# Patient Record
Sex: Male | Born: 1955 | Race: Black or African American | Hispanic: No | Marital: Single | Smoking: Current every day smoker
Health system: Southern US, Community
[De-identification: ages and names within clinical notes are randomized; demographics above are authoritative.]

## PROBLEM LIST (undated history)

## (undated) ENCOUNTER — Emergency Department (HOSPITAL_COMMUNITY): Discharge status: Absent without leave | Payer: Self-pay

## (undated) DIAGNOSIS — M5126 Other intervertebral disc displacement, lumbar region: Secondary | ICD-10-CM

---

## 2004-08-11 ENCOUNTER — Emergency Department (HOSPITAL_COMMUNITY): Admission: EM | Admit: 2004-08-11 | Discharge: 2004-08-11 | Payer: Self-pay | Admitting: Emergency Medicine

## 2008-02-26 DIAGNOSIS — M5126 Other intervertebral disc displacement, lumbar region: Secondary | ICD-10-CM

## 2008-02-26 HISTORY — DX: Other intervertebral disc displacement, lumbar region: M51.26

## 2015-05-17 ENCOUNTER — Inpatient Hospital Stay (HOSPITAL_COMMUNITY)
Admission: EM | Admit: 2015-05-17 | Discharge: 2015-05-20 | DRG: 871 | Discharge status: Against Medical Advice | Payer: Medicaid Other | Attending: Internal Medicine | Admitting: Internal Medicine

## 2015-05-17 ENCOUNTER — Emergency Department (HOSPITAL_COMMUNITY): Payer: Medicaid Other

## 2015-05-17 ENCOUNTER — Encounter (HOSPITAL_COMMUNITY): Payer: Self-pay

## 2015-05-17 DIAGNOSIS — J9602 Acute respiratory failure with hypercapnia: Secondary | ICD-10-CM

## 2015-05-17 DIAGNOSIS — E876 Hypokalemia: Secondary | ICD-10-CM | POA: Diagnosis not present

## 2015-05-17 DIAGNOSIS — A411 Sepsis due to other specified staphylococcus: Principal | ICD-10-CM | POA: Diagnosis present

## 2015-05-17 DIAGNOSIS — F1110 Opioid abuse, uncomplicated: Secondary | ICD-10-CM | POA: Diagnosis present

## 2015-05-17 DIAGNOSIS — F1410 Cocaine abuse, uncomplicated: Secondary | ICD-10-CM | POA: Diagnosis present

## 2015-05-17 DIAGNOSIS — E872 Acidosis, unspecified: Secondary | ICD-10-CM | POA: Insufficient documentation

## 2015-05-17 DIAGNOSIS — R404 Transient alteration of awareness: Secondary | ICD-10-CM | POA: Diagnosis present

## 2015-05-17 DIAGNOSIS — J9690 Respiratory failure, unspecified, unspecified whether with hypoxia or hypercapnia: Secondary | ICD-10-CM | POA: Insufficient documentation

## 2015-05-17 DIAGNOSIS — R4189 Other symptoms and signs involving cognitive functions and awareness: Secondary | ICD-10-CM

## 2015-05-17 DIAGNOSIS — D72820 Lymphocytosis (symptomatic): Secondary | ICD-10-CM | POA: Diagnosis present

## 2015-05-17 DIAGNOSIS — N179 Acute kidney failure, unspecified: Secondary | ICD-10-CM | POA: Diagnosis present

## 2015-05-17 DIAGNOSIS — J9601 Acute respiratory failure with hypoxia: Secondary | ICD-10-CM | POA: Diagnosis present

## 2015-05-17 DIAGNOSIS — R739 Hyperglycemia, unspecified: Secondary | ICD-10-CM | POA: Diagnosis present

## 2015-05-17 DIAGNOSIS — R0600 Dyspnea, unspecified: Secondary | ICD-10-CM | POA: Diagnosis not present

## 2015-05-17 DIAGNOSIS — G9340 Encephalopathy, unspecified: Secondary | ICD-10-CM | POA: Diagnosis present

## 2015-05-17 DIAGNOSIS — J690 Pneumonitis due to inhalation of food and vomit: Secondary | ICD-10-CM | POA: Diagnosis present

## 2015-05-17 DIAGNOSIS — F1910 Other psychoactive substance abuse, uncomplicated: Secondary | ICD-10-CM | POA: Diagnosis not present

## 2015-05-17 LAB — CBC WITH DIFFERENTIAL/PLATELET
BASOS ABS: 0.2 10*3/uL — AB (ref 0.0–0.1)
BASOS ABS: 0 10*3/uL (ref 0.0–0.1)
BASOS PCT: 0 %
Basophils Relative: 1 %
EOS ABS: 0.1 10*3/uL (ref 0.0–0.7)
EOS PCT: 3 %
Eosinophils Absolute: 0.5 10*3/uL (ref 0.0–0.7)
Eosinophils Relative: 1 %
HEMATOCRIT: 46.2 % (ref 39.0–52.0)
HEMATOCRIT: 44.6 % (ref 39.0–52.0)
Hemoglobin: 14.4 g/dL (ref 13.0–17.0)
Hemoglobin: 14.1 g/dL (ref 13.0–17.0)
LYMPHS ABS: 9.7 10*3/uL — AB (ref 0.7–4.0)
Lymphocytes Relative: 62 %
Lymphocytes Relative: 18 %
Lymphs Abs: 1.6 10*3/uL (ref 0.7–4.0)
MCH: 30.2 pg (ref 26.0–34.0)
MCH: 29.9 pg (ref 26.0–34.0)
MCHC: 31.2 g/dL (ref 30.0–36.0)
MCHC: 31.6 g/dL (ref 30.0–36.0)
MCV: 96.9 fL (ref 78.0–100.0)
MCV: 94.7 fL (ref 78.0–100.0)
MONO ABS: 1.6 10*3/uL — AB (ref 0.1–1.0)
MONO ABS: 0.8 10*3/uL (ref 0.1–1.0)
MONOS PCT: 10 %
MONOS PCT: 9 %
NEUTROS ABS: 3.8 10*3/uL (ref 1.7–7.7)
NEUTROS ABS: 6.4 10*3/uL (ref 1.7–7.7)
Neutrophils Relative %: 24 %
Neutrophils Relative %: 72 %
PLATELETS: 226 10*3/uL (ref 150–400)
PLATELETS: 188 10*3/uL (ref 150–400)
RBC: 4.77 MIL/uL (ref 4.22–5.81)
RBC: 4.71 MIL/uL (ref 4.22–5.81)
RDW: 14 % (ref 11.5–15.5)
RDW: 14 % (ref 11.5–15.5)
WBC: 15.8 10*3/uL — ABNORMAL HIGH (ref 4.0–10.5)
WBC: 8.9 10*3/uL (ref 4.0–10.5)

## 2015-05-17 LAB — BASIC METABOLIC PANEL
ANION GAP: 10 (ref 5–15)
BUN: 8 mg/dL (ref 6–20)
CALCIUM: 8.3 mg/dL — AB (ref 8.9–10.3)
CO2: 20 mmol/L — AB (ref 22–32)
CREATININE: 0.92 mg/dL (ref 0.61–1.24)
Chloride: 111 mmol/L (ref 101–111)
Glucose, Bld: 86 mg/dL (ref 65–99)
Potassium: 4.2 mmol/L (ref 3.5–5.1)
SODIUM: 141 mmol/L (ref 135–145)

## 2015-05-17 LAB — BLOOD GAS, ARTERIAL
Acid-base deficit: 5.5 mmol/L — ABNORMAL HIGH (ref 0.0–2.0)
Bicarbonate: 19.2 mEq/L — ABNORMAL LOW (ref 20.0–24.0)
Drawn by: 257701
FIO2: 1
MECHVT: 620 mL
O2 Saturation: 99.4 %
PATIENT TEMPERATURE: 98.6
PEEP/CPAP: 5 cmH2O
PO2 ART: 346 mmHg — AB (ref 80.0–100.0)
RATE: 23 resp/min
TCO2: 16.9 mmol/L (ref 0–100)
pCO2 arterial: 36.5 mmHg (ref 35.0–45.0)
pH, Arterial: 7.34 — ABNORMAL LOW (ref 7.350–7.450)

## 2015-05-17 LAB — COMPREHENSIVE METABOLIC PANEL
ALT: 56 U/L (ref 17–63)
ANION GAP: 13 (ref 5–15)
AST: 78 U/L — AB (ref 15–41)
Albumin: 3.7 g/dL (ref 3.5–5.0)
Alkaline Phosphatase: 71 U/L (ref 38–126)
BILIRUBIN TOTAL: 0.5 mg/dL (ref 0.3–1.2)
BUN: 8 mg/dL (ref 6–20)
CHLORIDE: 105 mmol/L (ref 101–111)
CO2: 20 mmol/L — ABNORMAL LOW (ref 22–32)
Calcium: 8.4 mg/dL — ABNORMAL LOW (ref 8.9–10.3)
Creatinine, Ser: 1.34 mg/dL — ABNORMAL HIGH (ref 0.61–1.24)
GFR, EST NON AFRICAN AMERICAN: 56 mL/min — AB (ref >60)
Glucose, Bld: 283 mg/dL — ABNORMAL HIGH (ref 65–99)
POTASSIUM: 3.5 mmol/L (ref 3.5–5.1)
Sodium: 138 mmol/L (ref 135–145)
TOTAL PROTEIN: 7.1 g/dL (ref 6.5–8.1)

## 2015-05-17 LAB — PROTIME-INR
INR: 1.45 (ref 0.00–1.49)
Prothrombin Time: 17.2 seconds — ABNORMAL HIGH (ref 11.6–15.2)

## 2015-05-17 LAB — I-STAT CG4 LACTIC ACID, ED: Lactic Acid, Venous: 4.66 mmol/L (ref 0.5–2.0)

## 2015-05-17 LAB — AMMONIA: AMMONIA: 52 umol/L — AB (ref 9–35)

## 2015-05-17 LAB — GLUCOSE, CAPILLARY: GLUCOSE-CAPILLARY: 88 mg/dL (ref 65–99)

## 2015-05-17 LAB — MRSA PCR SCREENING: MRSA BY PCR: NEGATIVE

## 2015-05-17 LAB — ETHANOL: Alcohol, Ethyl (B): <5 mg/dL (ref <5)

## 2015-05-17 LAB — LACTIC ACID, PLASMA: Lactic Acid, Venous: 2.3 mmol/L (ref 0.5–2.0)

## 2015-05-17 MED ORDER — NALOXONE HCL 2 MG/2ML IJ SOSY
PREFILLED_SYRINGE | INTRAMUSCULAR | Status: AC
  Administered 2015-05-17: 1 mg
  Filled 2015-05-17: qty 2

## 2015-05-17 MED ORDER — ONDANSETRON HCL 4 MG/2ML IJ SOLN: 4.0000 mg | Freq: Four times a day (QID) | INTRAMUSCULAR | Status: DC | PRN

## 2015-05-17 MED ORDER — PROPOFOL 1000 MG/100ML IV EMUL
0.0000 ug/kg/min | INTRAVENOUS | Status: DC
  Administered 2015-05-17: 30 ug/kg/min via INTRAVENOUS
  Administered 2015-05-18: 50 ug/kg/min via INTRAVENOUS
  Administered 2015-05-18: 30 ug/kg/min via INTRAVENOUS
  Administered 2015-05-18: 50 ug/kg/min via INTRAVENOUS
  Filled 2015-05-17 (×4): qty 100

## 2015-05-17 MED ORDER — SODIUM CHLORIDE 0.9 % IV SOLN
3.0000 g | Freq: Three times a day (TID) | INTRAVENOUS | Status: DC
  Administered 2015-05-17 – 2015-05-18 (×2): 3 g via INTRAVENOUS
  Filled 2015-05-17 (×3): qty 3

## 2015-05-17 MED ORDER — ETOMIDATE 2 MG/ML IV SOLN
20.0000 mg | Freq: Once | INTRAVENOUS | Status: AC
  Administered 2015-05-17: 20 mg via INTRAVENOUS

## 2015-05-17 MED ORDER — MIDAZOLAM HCL 2 MG/2ML IJ SOLN
2.0000 mg | Freq: Once | INTRAMUSCULAR | Status: AC
Start: 2015-05-17 — End: 2015-05-17
  Administered 2015-05-17: 2 mg via INTRAVENOUS

## 2015-05-17 MED ORDER — PROPOFOL 10 MG/ML IV BOLUS
80.0000 mg | Freq: Once | INTRAVENOUS | Status: AC
  Administered 2015-05-17: 80 mg via INTRAVENOUS

## 2015-05-17 MED ORDER — ACETAMINOPHEN 325 MG PO TABS
650.0000 mg | ORAL_TABLET | ORAL | Status: DC | PRN
  Administered 2015-05-19: 650 mg via ORAL
  Filled 2015-05-17: qty 2

## 2015-05-17 MED ORDER — SODIUM CHLORIDE 0.9 % IV SOLN
INTRAVENOUS | Status: DC
  Administered 2015-05-17: 16:00:00 via INTRAVENOUS
  Administered 2015-05-18: 1000 mL via INTRAVENOUS

## 2015-05-17 MED ORDER — ENOXAPARIN SODIUM 40 MG/0.4ML ~~LOC~~ SOLN
40.0000 mg | SUBCUTANEOUS | Status: DC
  Administered 2015-05-17 – 2015-05-19 (×3): 40 mg via SUBCUTANEOUS
  Filled 2015-05-17 (×3): qty 0.4

## 2015-05-17 MED ORDER — PROPOFOL 1000 MG/100ML IV EMUL
5.0000 ug/kg/min | Freq: Once | INTRAVENOUS | Status: AC
  Administered 2015-05-17: 7 ug/kg/min via INTRAVENOUS

## 2015-05-17 MED ORDER — FENTANYL CITRATE (PF) 100 MCG/2ML IJ SOLN
100.0000 ug | INTRAMUSCULAR | Status: AC | PRN
  Administered 2015-05-18 (×3): 100 ug via INTRAVENOUS
  Filled 2015-05-17 (×4): qty 2

## 2015-05-17 MED ORDER — SODIUM CHLORIDE 0.9 % IV SOLN: 250.0000 mL | INTRAVENOUS | Status: DC | PRN

## 2015-05-17 MED ORDER — MIDAZOLAM HCL 2 MG/2ML IJ SOLN
2.0000 mg | Freq: Once | INTRAMUSCULAR | Status: AC
  Administered 2015-05-17: 2 mg via INTRAVENOUS

## 2015-05-17 MED ORDER — FAMOTIDINE IN NACL 20-0.9 MG/50ML-% IV SOLN
20.0000 mg | Freq: Two times a day (BID) | INTRAVENOUS | Status: DC
  Administered 2015-05-17 – 2015-05-18 (×3): 20 mg via INTRAVENOUS
  Filled 2015-05-17 (×3): qty 50

## 2015-05-17 MED ORDER — SODIUM CHLORIDE 0.9 % IV SOLN
INTRAVENOUS | Status: DC
  Administered 2015-05-17: 18:00:00 via INTRAVENOUS
  Administered 2015-05-18: 1000 mL via INTRAVENOUS

## 2015-05-17 MED ORDER — INSULIN ASPART 100 UNIT/ML ~~LOC~~ SOLN
0.0000 [IU] | SUBCUTANEOUS | Status: DC
  Administered 2015-05-18 – 2015-05-19 (×2): 2 [IU] via SUBCUTANEOUS

## 2015-05-17 MED ORDER — PROPOFOL 1000 MG/100ML IV EMUL
INTRAVENOUS | Status: AC
  Administered 2015-05-17: 7 ug/kg/min via INTRAVENOUS
  Filled 2015-05-17: qty 100

## 2015-05-17 MED ORDER — FENTANYL CITRATE (PF) 100 MCG/2ML IJ SOLN
100.0000 ug | INTRAMUSCULAR | Status: DC | PRN
  Administered 2015-05-18: 100 ug via INTRAVENOUS
  Filled 2015-05-17: qty 2

## 2015-05-17 MED ORDER — MIDAZOLAM HCL 2 MG/2ML IJ SOLN
INTRAMUSCULAR | Status: AC
  Filled 2015-05-17: qty 2

## 2015-05-17 MED ORDER — SODIUM CHLORIDE 0.9 % IV BOLUS (SEPSIS)
1000.0000 mL | Freq: Once | INTRAVENOUS | Status: AC
Start: 2015-05-17 — End: 2015-05-17
  Administered 2015-05-17: 1000 mL via INTRAVENOUS

## 2015-05-17 MED ORDER — SUCCINYLCHOLINE CHLORIDE 20 MG/ML IJ SOLN
100.0000 mg | Freq: Once | INTRAMUSCULAR | Status: AC
  Administered 2015-05-17: 100 mg via INTRAVENOUS

## 2015-05-17 NOTE — Progress Notes (Signed)
Pharmacy Antibiotic Note  Leonard Leonard Bray Leonard Bray is a 60 y.o. male admitted on 05/17/2015 found unresponsive and intubated for airway protection, suspected aspiration pneumonia. Pharmacy has been consulted for Unasyn dosing. Medical history and allergies are unknown. 86kg. SCr elevated, CrCl ~60N.  Plan: Start Unasyn 3g IV q8h. Follow up renal fxn, culture results, and clinical course.   Weight: 190 lb (86.183 kg)  Temp (24hrs), Avg:94.5 F (34.7 C), Min:93.6 F (34.2 C), Max:95 F (35 C)   Recent Labs Lab 05/17/15 1445 05/17/15 1457  WBC 15.8*  --   CREATININE 1.34*  --   LATICACIDVEN  --  4.66*    CrCl cannot be calculated (Unknown ideal weight.).    Allergies not on file  Antimicrobials this admission: Unasyn 3/22 >>   Dose adjustments this admission:  Microbiology results: BCx: UCx:  Sputum:  MRSA PCR:  Thank you for allowing pharmacy to be a part of this patient's care.  Leonard Leonard Bray, PharmD, pager [phone]. 05/17/2015,4:59 PM.

## 2015-05-17 NOTE — ED Notes (Signed)
Pt. Belongings were checked out by security and documented on the patient belonging sheet.  The key was left in pt. Belonging chart. Nurse aware.

## 2015-05-17 NOTE — ED Notes (Signed)
Bed: RESB Expected date:  Expected time:  Means of arrival:  Comments: 

## 2015-05-17 NOTE — Progress Notes (Signed)
Utilization Review completed.  Amadeus Oyama RN CM  

## 2015-05-17 NOTE — ED Notes (Signed)
Pt was pulled out of the car. Friend who was driving patient is not sure what happened to patient, reports that he went unresponsive in the car. Pt arrives with agonal breathing, diaphoretic, unresponsive. MD at bedside.

## 2015-05-17 NOTE — Progress Notes (Signed)
Patient listed as having Medicaid [place] access without a pcp.  Pcp listed on patient's Medicaid card [facility] [address], [place] [phone].  EDCM unable to speak to patient as he is intubated.

## 2015-05-17 NOTE — ED Provider Notes (Signed)
CSN: [id number]     Arrival date & time 05/17/15  1434 History   First MD Initiated Contact with Patient 05/17/15 1445     Chief Complaint  Patient presents with  . Unresponsive    HPI  Patient presents unresponsive, and extremities. Level V caveat secondary to medical condition. Per report the patient told a companion that he was feeling lightheaded. All been transported home, the patient became unresponsive. Patient himself cannot provide any details of this, as he is unresponsive. Patient was transported via private vehicle.  History reviewed. No pertinent past medical history. History reviewed. No pertinent past surgical history. No family history on file. Social History  Substance Use Topics  . Smoking status: None  . Smokeless tobacco: None  . Alcohol Use: None    Review of Systems  Unable to perform ROS: Patient unresponsive      Allergies  Review of patient's allergies indicates not on file.  Home Medications   Prior to Admission medications   Not on File   BP 84/57 mmHg  Pulse 74  Temp(Src) 94.6 F (34.8 C) (Core (Comment))  Resp 23  Wt 190 lb (86.183 kg)  SpO2 100% Physical Exam  Constitutional: He appears well-developed and well-nourished. He appears distressed.  HENT:  Head: Normocephalic and atraumatic.  Patient with gurgling respiration  Eyes: Conjunctivae are normal.  Disconjugate gaze, pinpoint pupils  Cardiovascular: Normal rate and regular rhythm.   Pulmonary/Chest: No stridor.  Decreased respiratory effort  Abdominal: He exhibits no distension.  Musculoskeletal: He exhibits no edema.  No gross deformities  Neurological: He is unresponsive.  Flaccid, does not follow commands, does not respond to painful sternal rub  Skin: Skin is warm and dry.  Psychiatric: Cognition and memory are impaired.  Nursing note and vitals reviewed.   ED Course  Procedures (including critical care time)  After the initial evaluation the patient received  intermuscular Narcan, with no change in his condition    After the initial evaluation, with concern for no airway protection given the patient's flaccid status, patient had intubation.  To facilitate IV access patient had IO line placed.  Angiocath insertion - Interosseous Performed by: Gerhard MunchLOCKWOOD, Cleta Heatley  Consent: Verbal consent obtained. Risks and benefits: risks, benefits and alternatives were considered, but with emergent need for access, consent was implied. Time out: Immediately prior to procedure a "time out" was called to verify the correct patient, procedure, equipment, support staff and site/side marked as required.  Preparation: Patient was prepped and draped in the usual sterile fashion.  L tibial anterior proximal    Normal blood return and flush without difficulty and bolus of NS  Patient tolerance: Patient tolerated the procedure well with no immediate complications.   INTUBATION Performed by: Gerhard MunchLOCKWOOD, Myria Steenbergen  Required items: required blood products, implants, devices, and special equipment available Patient identity confirmed: provided demographic data and hospital-assigned identification number Time out: Immediately prior to procedure a "time out" was called to verify the correct patient, procedure, equipment, support staff and site/side marked as required.  Indications: airway protection   Intubation method: Glidescope Laryngoscopy   Preoxygenation: BVM and [place] high flow  Sedatives: 20Etomidate Paralytic: 100Succinylcholine  Tube Size: 8 cuffed  Post-procedure assessment: chest rise and ETCO2 monitor Breath sounds: equal and absent over the epigastrium Tube secured with: ETT holder Chest x-ray interpreted by radiologist and me.  Chest x-ray findings: endotracheal tube in appropriate position  Patient tolerated the procedure well with no immediate complications.  Labs Review Labs Reviewed  COMPREHENSIVE METABOLIC PANEL - Abnormal;  Notable for the following:    CO2 20 (*)    Glucose, Bld 283 (*)    Creatinine, Ser 1.34 (*)    Calcium 8.4 (*)    AST 78 (*)    GFR calc non Af Amer 56 (*)    All other components within normal limits  CBC WITH DIFFERENTIAL/PLATELET - Abnormal; Notable for the following:    WBC 15.8 (*)    Lymphs Abs 9.7 (*)    Monocytes Absolute 1.6 (*)    Basophils Absolute 0.2 (*)    All other components within normal limits  PROTIME-INR - Abnormal; Notable for the following:    Prothrombin Time 17.2 (*)    All other components within normal limits  I-STAT CG4 LACTIC ACID, ED - Abnormal; Notable for the following:    Lactic Acid, Venous 4.66 (*)    All other components within normal limits  ETHANOL  PATHOLOGIST SMEAR REVIEW    Initial labs notable for lactic acidosis.     Imaging Review Ct Head Wo Contrast  05/17/2015  CLINICAL DATA:  Unresponsive/altered mental status EXAM: CT HEAD WITHOUT CONTRAST TECHNIQUE: Contiguous axial images were obtained from the base of the skull through the vertex without intravenous contrast. COMPARISON:  None. FINDINGS: The ventricles are normal in size and configuration. There is no intracranial mass, hemorrhage, extra-axial fluid collection, or midline shift. There is mild small vessel disease in the centra semiovale bilaterally. Elsewhere, gray-white compartments appear normal. No acute infarct evident. The bony calvarium appears intact. The mastoid air cells are clear. There is mucosal thickening in the right maxillary antrum. There is atrophy of the left globe with extensive calcification within the left globe. Right orbit appears unremarkable. IMPRESSION: Mild periventricular small vessel disease. No intracranial mass, hemorrhage, or extra-axial fluid collection. No acute infarct evident. Left phthisis bulbi. Right maxillary sinus disease. Electronically Signed   By: Bretta Bang III M.D.   On: 05/17/2015 14:59   Dg Chest Port 1 View  05/17/2015   CLINICAL DATA:  60 year old male with a history of endotracheal tube placement EXAM: PORTABLE CHEST 1 VIEW COMPARISON:  None. FINDINGS: Cardiomediastinal silhouette within normal limits. Fullness in the central vasculature with interstitial prominence, likely related to low lung volumes. Trace fluid or thickening of the minor fissure. No confluent airspace disease pneumothorax or pleural effusion. Endotracheal tube terminates suitably above the carina, measuring 5.7 cm. Overlying EKG leads. IMPRESSION: Endotracheal tube terminates suitably above the carina, 5.7 cm. Low lung volumes with likely atelectasis. No evidence of lobar pneumonia. Signed, Yvone Neu. Loreta Ave, DO [facility] Electronically Signed   By: Gilmer Mor D.O.   On: 05/17/2015 15:06   I have personally reviewed and evaluated these images and lab results as part of my medical decision-making.   EKG Interpretation   Date/Time:  Wednesday May 17 2015 14:37:06 EDT Ventricular Rate:  82 PR Interval:  163 QRS Duration: 106 QT Interval:  406 QTC Calculation: 474 R Axis:   31 Text Interpretation:  Sinus rhythm Probable anteroseptal infarct, old  Sinus rhythm Artifact T wave abnormality Abnormal ekg Confirmed by  Gerhard Munch  MD [phone]) on 05/17/2015 2:54:32 PM     4:02 PM After initial sedation the patient had a spell of agitation, during which the patient had minimal propofol drip infusing, the patient was substantially more sedated after bolus, 80 mg, and increase in the titration.Marland Kitchen   I  have discussed the patient's case with our critical care colleagues.  MDM   This patient presents in extremis, unresponsive, flaccid, with no response to painful stimuli.   Patient's history is unknown, and with the concern for infection he had intubation. Patient received fluids, Narcan, oxygen, with no change in his mental status. Patient's initial CT scan did not show acute  bleeding, but initial labs were notable for lactic acidosis, leukocytosis. Presentation may be secondary to occult stroke versus ingestion versus seizure versus occult infection. Patient required continuous propofol sedation for facilitation of care, and on sign and is awaiting critical care evaluation for mission to the ICU.   CRITICAL CARE Performed by: Gerhard Munch Total critical care time: 35 minutes Critical care time was exclusive of separately billable procedures and treating other patients. Critical care was necessary to treat or prevent imminent or life-threatening deterioration. Critical care was time spent personally by me on the following activities: development of treatment plan with patient and/or surrogate as well as nursing, discussions with consultants, evaluation of patient's response to treatment, examination of patient, obtaining history from patient or surrogate, ordering and performing treatments and interventions, ordering and review of laboratory studies, ordering and review of radiographic studies, pulse oximetry and re-evaluation of patient's condition.    Gerhard Munch, MD 05/17/15 [phone]

## 2015-05-17 NOTE — H&P (Signed)
PULMONARY / CRITICAL CARE MEDICINE   Name: Leonard Bray MRN: [id number] DOB: [birth year]    ADMISSION DATE:  05/17/2015 CONSULTATION DATE:  05/17/2015  REFERRING MD:  Dr. Jeraldine Loots, ER physician  CHIEF COMPLAINT:  Acute encephalopathy  HISTORY OF PRESENT ILLNESS:   This is a 60 year old male who was found unresponsive today and brought to the emergency department. He was intubated for airway protection. Apparently he asked a friend to bring him because he felt lightheaded. The friend did not stay nor did she provide more history. [facility] police stated that he had been found at a gas station. They note a significant criminal history but they have no information about his medical history. History could not be obtained from the patient because he was encephalopathic in the emergency department. After intubation he did become combative and required propofol infusion.  We could not obtain past medical history family history, surgical history, allergies, or review of systems because he was encephalopathic.  SUBJECTIVE:  As above  VITAL SIGNS: BP 113/81 mmHg  Pulse 78  Temp(Src) 95 F (35 C) (Core (Comment))  Resp 23  Wt 86.183 kg (190 lb)  SpO2 100%  HEMODYNAMICS:    VENTILATOR SETTINGS: Vent Mode:  [-] PRVC FiO2 (%):  [100 %] 100 % Set Rate:  [23 bmp] 23 bmp Vt Set:  [478 mL] 620 mL PEEP:  [5 cmH20] 5 cmH20 Plateau Pressure:  [10 cmH20] 10 cmH20  INTAKE / OUTPUT:    PHYSICAL EXAMINATION: General:  Sedated on vent Neuro:  Heavily sedated, propofol infusion HEENT:  Normocephalic atraumatic, chronic appearing scarring and atrophy of the left eye, right pupil round and reactive to light, sclerae anicteric Cardiovascular:  Distant heart sounds, no clear murmurs on my exam, no JVD Lungs:  Few crackles bases, otherwise clear with ventilator supported breaths, there are what appear to be multiple surgical scars from thoracic trochars on his left chest Abdomen:  Bowel sounds  positive, nontender nondistended, there are surgical scars from what appears to be an exploratory laparotomy Musculoskeletal:  Normal bulk and tone Skin:  There are old, well-healed jagged scars on his left arm  LABS:  BMET  Recent Labs Lab 05/17/15 1445  NA 138  K 3.5  CL 105  CO2 20*  BUN 8  CREATININE 1.34*  GLUCOSE 283*    Electrolytes  Recent Labs Lab 05/17/15 1445  CALCIUM 8.4*    CBC  Recent Labs Lab 05/17/15 1445  WBC 15.8*  HGB 14.4  HCT 46.2  PLT 226    Coag's  Recent Labs Lab 05/17/15 1445  INR 1.45    Sepsis Markers  Recent Labs Lab 05/17/15 1457  LATICACIDVEN 4.66*    ABG No results for input(s): PHART, PCO2ART, PO2ART in the last 168 hours.  Liver Enzymes  Recent Labs Lab 05/17/15 1445  AST 78*  ALT 56  ALKPHOS 71  BILITOT 0.5  ALBUMIN 3.7    Cardiac Enzymes No results for input(s): TROPONINI, PROBNP in the last 168 hours.  Glucose No results for input(s): GLUCAP in the last 168 hours.  Imaging Ct Head Wo Contrast  05/17/2015  CLINICAL DATA:  Unresponsive/altered mental status EXAM: CT HEAD WITHOUT CONTRAST TECHNIQUE: Contiguous axial images were obtained from the base of the skull through the vertex without intravenous contrast. COMPARISON:  None. FINDINGS: The ventricles are normal in size and configuration. There is no intracranial mass, hemorrhage, extra-axial fluid collection, or midline shift. There is mild small vessel disease in the centra semiovale bilaterally. Elsewhere,  gray-white compartments appear normal. No acute infarct evident. The bony calvarium appears intact. The mastoid air cells are clear. There is mucosal thickening in the right maxillary antrum. There is atrophy of the left globe with extensive calcification within the left globe. Right orbit appears unremarkable. IMPRESSION: Mild periventricular small vessel disease. No intracranial mass, hemorrhage, or extra-axial fluid collection. No acute infarct  evident. Left phthisis bulbi. Right maxillary sinus disease. Electronically Signed   By: Bretta BangWilliam  Woodruff III M.D.   On: 05/17/2015 14:59   Dg Chest Port 1 View  05/17/2015  CLINICAL DATA:  60 year old male with a history of endotracheal tube placement EXAM: PORTABLE CHEST 1 VIEW COMPARISON:  None. FINDINGS: Cardiomediastinal silhouette within normal limits. Fullness in the central vasculature with interstitial prominence, likely related to low lung volumes. Trace fluid or thickening of the minor fissure. No confluent airspace disease pneumothorax or pleural effusion. Endotracheal tube terminates suitably above the carina, measuring 5.7 cm. Overlying EKG leads. IMPRESSION: Endotracheal tube terminates suitably above the carina, 5.7 cm. Low lung volumes with likely atelectasis. No evidence of lobar pneumonia. Signed, Yvone NeuJaime S. Loreta AveWagner, DO [facility] Electronically Signed   By: Gilmer MorJaime  Wagner D.O.   On: 05/17/2015 15:06     STUDIES:  05/17/2015 CT head: No acute intracranial process  CULTURES: 05/17/2015 urine culture 05/12/2015 respiratory culture 05/17/2015 blood culture 05/17/2015 respiratory viral panel  ANTIBIOTICS: 05/17/2015 Unasyn  SIGNIFICANT EVENTS: 05/17/2015 admission  LINES/TUBES: 05/17/2015 endotracheal tube  DISCUSSION: This is a 60 year old male who is brought to the emergency department today after feeling lightheaded and then was acutely encephalopathic in the emergency department. The underlying cause is uncertain at this time. There is no evidence of acute traumatic or hemorrhagic process in the brain. He has a mildly elevated lactic acid which is uncertain etiology and could be due to poor perfusion that he has no evidence of this based on his physical exam or his vital signs. Differential diagnosis includes ischemic bowel or less likely seizure disorder as a cause of the elevated lactic acid. Thinking broadly, its  also possible that there is a metabolic derangement or he recently had an ingestion of some sort which led to his altered mental status. Regardless, the underlying cause at this time is unclear and we need a broad workup.  ASSESSMENT / PLAN:  NEUROLOGIC A:   Acute encephalopathy, differential diagnosis broad as stated above P:   RASS goal: -1 Propofol infusion for vent synchrony EEG TSH Pneumonia B12 Folate MRI Brain tomorrow if no improvement Urine drug screen  PULMONARY A: Acute respiratory failure with hypoxemia secondary to inability to control airway P:   Continue full ventilator support Ventilator associated pneumonia prevention Daily chest x-ray Daily wakeup assessment and spontaneous breathing trial ABG  CARDIOVASCULAR A:  No acute issues P:  Telemetry monitoring  RENAL A:   Mildly elevated creatinine, uncertain baseline P:   Repeat basic metabolic panel later today Monitor BMET and UOP Replace electrolytes as needed   GASTROINTESTINAL A:   History of exploratory laparotomy Elevated lactic acid, uncertain etiology P:   Repeat lactic acid, if elevated perform CT abdomen pelvis looking for ischemic bowel Pepcid for stress ulcer prophylaxis  HEMATOLOGIC A:   Leukocytosis with absolute lymphocytosis, uncertain etiology question viral illness P:  Repeat CBC with differential later today Monitor for bleeding Lovenox as needed for DVT prevention  INFECTIOUS A:   Question sepsis, but there is no clear source on his current physical exam, mild  interstitial changes on chest x-ray, question aspiration given altered mental status P:   Pan culture Start Unasyn for questionable aspiration pneumonia but would have low threshold to stop this tomorrow Rule out influenza given high flow activity in our area  ENDOCRINE A:   Hyperglycemia, uncertain baseline   P:   Check hemoglobin A1c Point care glucose monitoring    FAMILY  - Updates: None  bedside  - Inter-disciplinary family meet or Palliative Care meeting due by: day 7   My cc time 45 minutes  Heber [place], MD [facility] Pager: [phone] Cell: [phone] After 3pm or if no response, call [phone]   05/17/2015, 4:38 PM

## 2015-05-18 ENCOUNTER — Inpatient Hospital Stay (HOSPITAL_COMMUNITY)
Admit: 2015-05-18 | Discharge: 2015-05-18 | Disposition: A | Discharge status: Home / Self Care | Payer: Medicaid Other | Attending: Pulmonary Disease | Admitting: Pulmonary Disease

## 2015-05-18 ENCOUNTER — Inpatient Hospital Stay (HOSPITAL_COMMUNITY): Payer: Medicaid Other

## 2015-05-18 DIAGNOSIS — J9601 Acute respiratory failure with hypoxia: Secondary | ICD-10-CM

## 2015-05-18 DIAGNOSIS — R0600 Dyspnea, unspecified: Secondary | ICD-10-CM

## 2015-05-18 DIAGNOSIS — E872 Acidosis, unspecified: Secondary | ICD-10-CM | POA: Insufficient documentation

## 2015-05-18 DIAGNOSIS — G9340 Encephalopathy, unspecified: Secondary | ICD-10-CM

## 2015-05-18 DIAGNOSIS — J9690 Respiratory failure, unspecified, unspecified whether with hypoxia or hypercapnia: Secondary | ICD-10-CM | POA: Insufficient documentation

## 2015-05-18 LAB — BASIC METABOLIC PANEL
ANION GAP: 8 (ref 5–15)
BUN: 6 mg/dL (ref 6–20)
CALCIUM: 8.4 mg/dL — AB (ref 8.9–10.3)
CO2: 20 mmol/L — ABNORMAL LOW (ref 22–32)
Chloride: 113 mmol/L — ABNORMAL HIGH (ref 101–111)
Creatinine, Ser: 0.97 mg/dL (ref 0.61–1.24)
GFR calc Af Amer: >60 mL/min (ref >60)
GLUCOSE: 73 mg/dL (ref 65–99)
Potassium: 4 mmol/L (ref 3.5–5.1)
SODIUM: 141 mmol/L (ref 135–145)

## 2015-05-18 LAB — TRIGLYCERIDES: Triglycerides: 156 mg/dL — ABNORMAL HIGH (ref <150)

## 2015-05-18 LAB — CBC
HCT: 43.4 % (ref 39.0–52.0)
Hemoglobin: 14.4 g/dL (ref 13.0–17.0)
MCH: 30 pg (ref 26.0–34.0)
MCHC: 33.2 g/dL (ref 30.0–36.0)
MCV: 90.4 fL (ref 78.0–100.0)
PLATELETS: 183 10*3/uL (ref 150–400)
RBC: 4.8 MIL/uL (ref 4.22–5.81)
RDW: 14 % (ref 11.5–15.5)
WBC: 8.2 10*3/uL (ref 4.0–10.5)

## 2015-05-18 LAB — GLUCOSE, CAPILLARY
GLUCOSE-CAPILLARY: 79 mg/dL (ref 65–99)
GLUCOSE-CAPILLARY: 78 mg/dL (ref 65–99)
GLUCOSE-CAPILLARY: 106 mg/dL — AB (ref 65–99)
Glucose-Capillary: 140 mg/dL — ABNORMAL HIGH (ref 65–99)
Glucose-Capillary: 62 mg/dL — ABNORMAL LOW (ref 65–99)
Glucose-Capillary: 72 mg/dL (ref 65–99)
Glucose-Capillary: 85 mg/dL (ref 65–99)

## 2015-05-18 LAB — FOLATE RBC
FOLATE, HEMOLYSATE: 458.9 ng/mL
FOLATE, RBC: 1087 ng/mL (ref >498)
HEMATOCRIT: 42.2 % (ref 37.5–51.0)

## 2015-05-18 LAB — RPR: RPR: NONREACTIVE

## 2015-05-18 LAB — ECHOCARDIOGRAM COMPLETE
HEIGHTINCHES: 72 in
WEIGHTICAEL: 3075.86 [oz_av]

## 2015-05-18 LAB — HIV ANTIBODY (ROUTINE TESTING W REFLEX): HIV SCREEN 4TH GENERATION: NONREACTIVE

## 2015-05-18 LAB — PATHOLOGIST SMEAR REVIEW

## 2015-05-18 LAB — RAPID URINE DRUG SCREEN, HOSP PERFORMED
AMPHETAMINES: NOT DETECTED
BENZODIAZEPINES: POSITIVE — AB
Barbiturates: NOT DETECTED
Cocaine: POSITIVE — AB
OPIATES: POSITIVE — AB
Tetrahydrocannabinol: POSITIVE — AB

## 2015-05-18 LAB — VITAMIN B12: Vitamin B-12: 438 pg/mL (ref 180–914)

## 2015-05-18 LAB — CORTISOL: CORTISOL PLASMA: 2.1 ug/dL

## 2015-05-18 LAB — TSH: TSH: 1.678 u[IU]/mL (ref 0.350–4.500)

## 2015-05-18 LAB — LACTIC ACID, PLASMA: LACTIC ACID, VENOUS: 1.1 mmol/L (ref 0.5–2.0)

## 2015-05-18 MED ORDER — ANTISEPTIC ORAL RINSE SOLUTION (CORINZ)
7.0000 mL | Freq: Four times a day (QID) | OROMUCOSAL | Status: DC
  Administered 2015-05-18: 7 mL via OROMUCOSAL

## 2015-05-18 MED ORDER — DEXTROSE-NACL 5-0.45 % IV SOLN
INTRAVENOUS | Status: DC
  Administered 2015-05-18: 17:00:00 via INTRAVENOUS
  Administered 2015-05-18: 1000 mL via INTRAVENOUS

## 2015-05-18 MED ORDER — CHLORHEXIDINE GLUCONATE 0.12% ORAL RINSE (MEDLINE KIT)
15.0000 mL | Freq: Two times a day (BID) | OROMUCOSAL | Status: DC
  Administered 2015-05-18 (×2): 15 mL via OROMUCOSAL

## 2015-05-18 MED ORDER — SODIUM CHLORIDE 0.9 % IV SOLN
25.0000 ug/h | INTRAVENOUS | Status: DC
  Filled 2015-05-18: qty 50

## 2015-05-18 MED ORDER — VITAL AF 1.2 CAL PO LIQD
1000.0000 mL | ORAL | Status: DC
  Filled 2015-05-18: qty 1000

## 2015-05-18 MED ORDER — CLONIDINE HCL 0.2 MG/24HR TD PTWK
0.2000 mg | MEDICATED_PATCH | TRANSDERMAL | Status: DC
  Administered 2015-05-18: 0.2 mg via TRANSDERMAL
  Filled 2015-05-18: qty 1

## 2015-05-18 MED ORDER — VITAL HIGH PROTEIN PO LIQD
1000.0000 mL | ORAL | Status: DC
  Filled 2015-05-18: qty 1000

## 2015-05-18 MED ORDER — DEXMEDETOMIDINE HCL IN NACL 200 MCG/50ML IV SOLN
0.4000 ug/kg/h | INTRAVENOUS | Status: DC
  Administered 2015-05-18: 1.2 ug/kg/h via INTRAVENOUS
  Administered 2015-05-18: 1.8 ug/kg/h via INTRAVENOUS
  Administered 2015-05-18: 1.2 ug/kg/h via INTRAVENOUS
  Administered 2015-05-18 (×2): 0.4 ug/kg/h via INTRAVENOUS
  Administered 2015-05-19: 0.5 ug/kg/h via INTRAVENOUS
  Filled 2015-05-18 (×8): qty 50

## 2015-05-18 MED ORDER — PRO-STAT SUGAR FREE PO LIQD: 60.0000 mL | Freq: Two times a day (BID) | ORAL | Status: DC

## 2015-05-18 MED ORDER — VANCOMYCIN HCL IN DEXTROSE 1-5 GM/200ML-% IV SOLN
1000.0000 mg | Freq: Two times a day (BID) | INTRAVENOUS | Status: DC
  Administered 2015-05-18 – 2015-05-19 (×3): 1000 mg via INTRAVENOUS
  Filled 2015-05-18 (×3): qty 200

## 2015-05-18 MED ORDER — BUDESONIDE 0.5 MG/2ML IN SUSP: 0.5000 mg | Freq: Two times a day (BID) | RESPIRATORY_TRACT | Status: DC

## 2015-05-18 MED ORDER — IPRATROPIUM-ALBUTEROL 0.5-2.5 (3) MG/3ML IN SOLN
3.0000 mL | Freq: Four times a day (QID) | RESPIRATORY_TRACT | Status: DC
  Administered 2015-05-18 – 2015-05-19 (×3): 3 mL via RESPIRATORY_TRACT
  Filled 2015-05-18 (×4): qty 3

## 2015-05-18 MED ORDER — HYDROCORTISONE NA SUCCINATE PF 100 MG IJ SOLR
100.0000 mg | Freq: Three times a day (TID) | INTRAMUSCULAR | Status: DC
  Filled 2015-05-18: qty 2

## 2015-05-18 MED ORDER — SODIUM CHLORIDE 0.9 % IV SOLN
3.0000 g | Freq: Four times a day (QID) | INTRAVENOUS | Status: DC
  Filled 2015-05-18: qty 3

## 2015-05-18 MED ORDER — DEXTROSE 50 % IV SOLN
25.0000 mL | Freq: Once | INTRAVENOUS | Status: AC
  Administered 2015-05-18: 25 mL via INTRAVENOUS
  Filled 2015-05-18: qty 50

## 2015-05-18 NOTE — Progress Notes (Addendum)
Initial Nutrition Assessment  INTERVENTION:   If patient expected to remain intubated >24 hours, recommend nutrition support.  TF recommendations: Initiate Vital AF 1.2  @ 20 ml/hr via OGT and increase by 10 ml every 4 hours to goal rate of 40 ml/hr.   60 ml Prostat BID Tube feeding regimen +Propofol infusion provides 2096 kcal (98% of needs), 132 grams of protein, and 779 ml of H2O.   RD to continue to monitor for plan  **Consult placed by MD to initiate TF. Adjusted order to reflect recommendations provided above.  NUTRITION DIAGNOSIS:   Inadequate oral intake related to inability to eat as evidenced by NPO status.  GOAL:   Patient will meet greater than or equal to 90% of their needs  MONITOR:   Vent status, Labs, Weight trends, I & O's  REASON FOR ASSESSMENT:   Ventilator    ASSESSMENT:   60 year old male who was found unresponsive today and brought to the emergency department. He was intubated for airway protection. Apparently he asked a friend to bring him because he felt lightheaded. The friend did not stay nor did she provide more history. [place] that he had been found at a gas station. They note a significant criminal history but they have no information about his medical history. History could not be obtained from the patient because he was encephalopathic in the emergency department. After intubation he did become combative and required propofol infusion.  Patient in room with no family at bedside. No medical history available. Nutrition focused physical exam shows no sign of depletion of muscle mass or body fat.  Patient is currently intubated on ventilator support MV: 13.9 L/min Temp (24hrs), Avg:97.5 F (36.4 C), Min:93.6 F (34.2 C), Max:99.5 F (37.5 C)  Propofol: 20.6 ml/hr -> provides 544 fat kcal  Labs reviewed. Medications reviewed.   Diet Order:     Skin:  Reviewed, no issues  Last BM:  PTA  Height:   Ht Readings from  Last 1 Encounters:  05/17/15 6' (1.829 m)    Weight:   Wt Readings from Last 1 Encounters:  05/18/15 192 lb 3.9 oz (87.2 kg)    Ideal Body Weight:  80.9 kg  BMI:  Body mass index is 26.07 kg/(m^2).  Estimated Nutritional Needs:   Kcal:  2137  Protein:  125-135g  Fluid:  2L/day  EDUCATION NEEDS:   No education needs identified at this time  Tilda FrancoLindsey Kallen Mccrystal, MS, RD, LDN Pager: [phone] After Hours Pager: [phone]

## 2015-05-18 NOTE — Procedures (Signed)
ELECTROENCEPHALOGRAM REPORT  Date of Study: 05/18/2015  Patient's Name: Leonard Bray Due MRN: [id number] Date of Birth: 1955-09-12  Referring Provider: Max Fickleouglas McQuaid, MD  Indication: 60 year old male encephalopathic following possible seizure.  He is intubated and sedated on propofol and fentanyl.  Medications:    0.9 % sodium chloride infusion  acetaminophen (TYLENOL) tablet 650 mg  antiseptic oral rinse solution (CORINZ)  budesonide (PULMICORT) nebulizer solution 0.5 mg  chlorhexidine gluconate (PERIDEX) 0.12 % solution 15 mL  dexmedetomidine (PRECEDEX) 200 MCG/50ML (4 mcg/mL) infusion  enoxaparin (LOVENOX) injection 40 mg  famotidine (PEPCID) IVPB 20 mg premix  fentaNYL (SUBLIMAZE) 2,500 mcg in sodium chloride 0.9 % 250 mL (10 mcg/mL) infusion  hydrocortisone sodium succinate (SOLU-CORTEF) 100 MG injection 100 mg  insulin aspart (novoLOG) injection 0-15 Units  ipratropium-albuterol (DUONEB) 0.5-2.5 (3) MG/3ML nebulizer solution 3 mL  ondansetron (ZOFRAN) injection 4 mg  propofol (DIPRIVAN) 1000 MG/100ML infusion  vancomycin (VANCOCIN) IVPB 1000 mg/200 mL premix    Technical Summary: This is a multichannel digital EEG recording, using the international 10-20 placement system with electrodes applied with paste and impedances below 5000 ohms.    Description: The EEG is symmetric with suppression of the background, consisting of 6Hz  theta and 2-3 Hz delta activity and no discernible posterior dominant rhythm.  Diffuse beta activity is seen.   There is muscle artifact.  No focal or generalized epileptiform discharges are seen.  Stage II sleep is not seen.  Hyperventilation was not performed.  Photic stimulation was performed, and produced no abnormalities.  ECG poor and difficult to read due to artifact.  Impression: This is an abnormal EEG due to generalized slowing.  This is indicative of diffuse cerebral dysfunction which is a nonspecific finding that may  be seen in toxic-metabolic, hypoxic, infectious, pharmacologic or other diffuse physiologic etiology.  [name] [name], DO

## 2015-05-18 NOTE — Progress Notes (Signed)
Pt self-extubated. Rt placed on 2 LPM [place]. PT stable at this time. Pete babcock NP at bedside.

## 2015-05-18 NOTE — Progress Notes (Signed)
Hypoglycemic Event  CBG: 62  Treatment: D50 IV 25 mL  Symptoms: None  Follow-up CBG: Time:00:50 CBG Result:85  Possible Reasons for Event: Unknown  Comments/MD notified:N/A    Leonard Bray D

## 2015-05-18 NOTE — Progress Notes (Signed)
*  PRELIMINARY RESULTS* Echocardiogram 2D Echocardiogram has been performed.  Leonard Bray, Leonard Bray 05/18/2015, 2:19 PM

## 2015-05-18 NOTE — Progress Notes (Signed)
eLink Physician-Brief Progress Note Patient Name: Leonard Bray DOB: 1956/01/04 MRN: [id number]   Date of Service  05/18/2015  HPI/Events of Note  Contacted by bedside nurse questioning maintenance IV fluids, diet, and blood glucose checks.  Patient self extubated today. Reportedly has past nursing bedside swallow evaluation.. Patient previously had borderline hypoglycemia on Solu-Cortef and IV fluid was switched to dextrose containing IV fluid today.  eICU Interventions  1. DC normal saline maintenance IV fluid & continue D5 half normal saline. 2. Clear liquid diet as tolerated 3. Continue Accu-Cheks every 4 hours until patient taking substantial oral intake 4. Continue low-dose sliding scale insulin per algorithm     Intervention Category Intermediate Interventions: Other:  Lawanda CousinsJennings Derrill Bagnell 05/18/2015, 4:30 PM

## 2015-05-18 NOTE — Progress Notes (Addendum)
Pharmacy Antibiotic Note  Leonard Bray is a 60 y.o. male admitted on 05/17/2015 found unresponsive and intubated for airway protection, suspected aspiration pneumonia. Pharmacy has been consulted for Unasyn dosing. Medical history and allergies are unknown. Vancomycin added 3/23 for GPC clusters (so far in 1/2 blood cultures). Repeat blood cultures ordered   Plan:  Adjust amp/sulbactam to 3gm IV q6h - now discontinued  Vancomycin 1gm IV q12h  Check trough at steady state  Follow up blood culture and rule out possible contaminant   Height: 6' (182.9 cm) Weight: 192 lb 3.9 oz (87.2 kg) IBW/kg (Calculated) : 77.6  Temp (24hrs), Avg:97.5 F (36.4 C), Min:93.6 F (34.2 C), Max:99.5 F (37.5 C)   Recent Labs Lab 05/17/15 1445 05/17/15 1457 05/17/15 1724 05/17/15 2116 05/18/15 0318  WBC 15.8*  --   --  8.9 8.2  CREATININE 1.34*  --   --  0.92 0.97  LATICACIDVEN  --  4.66* 2.3*  --   --     Estimated Creatinine Clearance: 88.9 mL/min (by C-G formula based on Cr of 0.97).    Allergies not on file  Antimicrobials this admission: Unasyn 3/22 >>  vanco 3/23 >>  Dose adjustments this admission:  Microbiology results: 3/22 BCx: GPC clusters 1/2 3/22 MRSA PCR: neg 3/23 Bcx  Thank you for allowing pharmacy to be a part of this patient's care.  Juliette Alcideustin Briante Loveall, PharmD, BCPS.   Pager: [phone] 05/18/2015 8:59 AM

## 2015-05-18 NOTE — Progress Notes (Signed)
eLink Physician-Brief Progress Note Patient Name: Leonard CarinaRandy Avitabile DOB: 02/04/1956 MRN: [id number]   Date of Service  05/18/2015  HPI/Events of Note  Lengthy camera check and discussion with the patient earlier this evening regarding his concerns. Patient was oriented to year, president, And season. Patient's IV earlier infiltrated. I explained to the patient that treatment of bacteremia is essential for prevention of further spread of infection and potential death. Patient now willing to accept placement of IV for further antibiotic treatment.  eICU Interventions  Joneen Roachaul Hoffman to attempt to establish IV access.     Intervention Category Major Interventions: Sepsis - evaluation and management  Lawanda CousinsJennings Loys Hoselton 05/18/2015, 10:35 PM

## 2015-05-18 NOTE — Progress Notes (Signed)
PULMONARY / CRITICAL CARE MEDICINE   Name: Leonard Bray MRN: [id number] DOB: 25-Oct-1955    ADMISSION DATE:  05/17/2015 CONSULTATION DATE:  05/17/2015  REFERRING MD:  Dr. Jeraldine LootsLockwood, ER physician  CHIEF COMPLAINT:  Acute encephalopathy  HISTORY OF PRESENT ILLNESS:   This is a 60 year old male who was found unresponsive on 3/22 He was intubated for airway protection. [place] that he had been found at a gas station. They note a significant criminal history but they have no information about his medical history. History could not be obtained from the patient because he was encephalopathic in the emergency department. After intubation he did become combative and required propofol infusion.   SUBJECTIVE:  As above  VITAL SIGNS: BP 110/72 mmHg  Pulse 59  Temp(Src) 98.2 F (36.8 C) (Core (Comment))  Resp 23  Ht 6' (1.829 m)  Wt 192 lb 3.9 oz (87.2 kg)  BMI 26.07 kg/m2  SpO2 100%  HEMODYNAMICS:    VENTILATOR SETTINGS: Vent Mode:  [-] PRVC FiO2 (%):  [30 %-100 %] 30 % Set Rate:  [23 bmp] 23 bmp Vt Set:  [413[620 mL] 620 mL PEEP:  [5 cmH20] 5 cmH20 Plateau Pressure:  [10 cmH20-23 cmH20] 23 cmH20  INTAKE / OUTPUT: I/O last 3 completed shifts: In: 3283.5 [I.V.:3003.5; NG/GT:30; IV Piggyback:250] Out: 1075 [Urine:825; Emesis/NG output:250]  PHYSICAL EXAMINATION: General:  Sitting up in bed while on propofol.  Neuro: awake, follows simple commands such as sticking out tongue. Moves all ext.  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 Abdomen:  Bowel sounds positive, nontender nondistended, there are surgical scars from what appears to be an exploratory  laparotomy Musculoskeletal:  Normal bulk and tone Skin:  There are old, well-healed jagged scars on his left arm  LABS:  BMET  Recent Labs Lab 05/17/15 1445 05/17/15 2116 05/18/15 0318  NA 138 141 141  K 3.5 4.2 4.0  CL 105 111 113*  CO2 20* 20* 20*  BUN 8 8 6   CREATININE 1.34* 0.92 0.97  GLUCOSE 283* 86 73    Electrolytes  Recent Labs Lab 05/17/15 1445 05/17/15 2116 05/18/15 0318  CALCIUM 8.4* 8.3* 8.4*    CBC  Recent Labs Lab 05/17/15 1445 05/17/15 2116 05/18/15 0318  WBC 15.8* 8.9 8.2  HGB 14.4 14.1 14.4  HCT 46.2 44.6 43.4  PLT 226 188 183    Coag's  Recent Labs Lab 05/17/15 1445  INR 1.45    Sepsis Markers  Recent Labs Lab 05/17/15 1457 05/17/15 1724  LATICACIDVEN 4.66* 2.3*    ABG  Recent Labs Lab 05/17/15 1731  PHART 7.340*  PCO2ART 36.5  PO2ART 346*    Liver Enzymes  Recent Labs Lab 05/17/15 1445  AST 78*  ALT 56  ALKPHOS 71  BILITOT 0.5  ALBUMIN 3.7    Cardiac Enzymes No results for input(s): TROPONINI, PROBNP in the last 168 hours.  Glucose  Recent Labs Lab 05/17/15 2021 05/17/15 2357 05/18/15 0048 05/18/15 0351 05/18/15 0820  GLUCAP 88 62* 85 72 79    Imaging Ct Head Wo Contrast  05/17/2015  CLINICAL DATA:  Unresponsive/altered mental status EXAM: CT HEAD WITHOUT CONTRAST TECHNIQUE: Contiguous axial images were obtained from the base of the skull through  the vertex without intravenous contrast. COMPARISON:  None. FINDINGS: The ventricles are normal in size and configuration. There is no intracranial mass, hemorrhage, extra-axial fluid collection, or midline shift. There is mild small vessel disease in the centra semiovale bilaterally. Elsewhere, gray-white compartments appear normal. No acute infarct evident. The bony calvarium appears intact. The mastoid air cells are clear. There is mucosal thickening in the right maxillary antrum. There is atrophy of the left globe with extensive calcification within  the left globe. Right orbit appears unremarkable. IMPRESSION: Mild periventricular small vessel disease. No intracranial mass, hemorrhage, or extra-axial fluid collection. No acute infarct evident. Left phthisis bulbi. Right maxillary sinus disease. Electronically Signed   By: Bretta Bang III M.D.   On: 05/17/2015 14:59   Dg Chest Port 1 View  05/17/2015  CLINICAL DATA:  60 year old male with a history of endotracheal tube placement EXAM: PORTABLE CHEST 1 VIEW COMPARISON:  None. FINDINGS: Cardiomediastinal silhouette within normal limits. Fullness in the central vasculature with interstitial prominence, likely related to low lung volumes. Trace fluid or thickening of the minor fissure. No confluent airspace disease pneumothorax or pleural effusion. Endotracheal tube terminates suitably above the carina, measuring 5.7 cm. Overlying EKG leads. IMPRESSION: Endotracheal tube terminates suitably above the carina, 5.7 cm. Low lung volumes with likely atelectasis. No evidence of lobar pneumonia. Signed, Yvone Neu. Loreta Ave, DO [facility] Electronically Signed   By: Gilmer Mor D.O.   On: 05/17/2015 15:06     STUDIES:  05/17/2015 CT head: No acute intracranial process  CULTURES: 05/17/2015 urine culture 05/12/2015 respiratory culture 05/17/2015 blood culture GPC clusters (2 bottles)>>> 05/17/2015 respiratory viral panel  ANTIBIOTICS: 05/17/2015 Unasyn>>>3/23 3/23 vanc>>>  SIGNIFICANT EVENTS: 05/17/2015 admission  LINES/TUBES: 05/17/2015 endotracheal tube  DISCUSSION: This is a 60 year old male who is brought to the emergency department today after feeling lightheaded and then was acutely encephalopathic in the emergency department. The underlying cause is uncertain at this time although highest on ddx is probable drug overdose. In light of GPC in 2 blood cultures would wonder about IVDA. Will add precedex, get ECHO, changed abx to  vanc. Hope to start weaning in next 24hrs.    ASSESSMENT / PLAN:  NEUROLOGIC A:   Acute encephalopathy, etiology unclear. Had UDS ordered but not sent on admit. Certainly overdose (unintentional vs intentional on ddx especially in light of GPC in 2 BCs raising concern about IVDA). Also consider sepsis w/ GPC in 2 of 2 blood cultures.  >ethanol level <5 >ammonia 52 >getting freq PRN fent.  P:   RASS goal: -1 F/u EEG  Pneumonia Urine drug screen (pending although may not be helpful) Consider MRI brain (only if no improvement in next 24 hrs)   PULMONARY A: Acute respiratory failure with hypoxemia secondary to inability to control airway > agitation and delirium major barrier to extubation currently  P:   Start precedex RAS goal -1 Repeat CXR in am Daily SBT and PSV trials as tolerated.  Suspect could be extubated soon   CARDIOVASCULAR A:  No acute issues P:  Telemetry monitoring  RENAL A:   Mildly elevated creatinine, uncertain baseline-->improved w/ IVFs Mild hyperchloremia  P:   Cont IVFs; change to D51/2 Strict I&O F/u am chemistry    GASTROINTESTINAL A:   History of exploratory laparotomy P:   Pepcid for stress ulcer prophylaxis Start tube feeds  HEMATOLOGIC A:   Leukocytosis with absolute lymphocytosis--.improved  P:  Repeat CBC in am  Monitor for bleeding  Lovenox as needed for DVT prevention  INFECTIOUS A:   GPC bacteremia: given presentation would question IVDA P:   abx changed to vanc Needs TTE initially. May need TEE  ENDOCRINE A:   Hyperglycemia, uncertain baseline   Cortisol < 3 but no hypotension  P:   f/u hemoglobin A1c Point care glucose monitoring Dc solucortef   FAMILY  - Updates: None bedside  - Inter-disciplinary family meet or Palliative Care meeting due by: day 7   Simonne Martinet ACNP-BC [facility]/Critical [facility] Pager # [phone] OR # [phone] if no answer  05/18/2015, 11:03 AM

## 2015-05-18 NOTE — Progress Notes (Signed)
Inpatient Diabetes Program Recommendations  AACE/ADA: New Consensus Statement on Inpatient Glycemic Control (2015)  Target Ranges:  Prepandial:   less than 140 mg/dL      Peak postprandial:   less than 180 mg/dL (1-2 hours)      Critically ill patients:  140 - 180 mg/dL   Review of Glycemic Control  Diabetes history: No Hx  Current orders for Inpatient glycemic control: Novolog correction scale moderate 0-15.  Inpatient Diabetes Program Recommendations:  Noted patient has no hx DM. Please consider discontinue of correction scale due to blood sugar <100. Will follow.  Thank you, Billy FischerJudy E. Jaydi Bray, RN, MSN, CDE Inpatient Glycemic Control Team Team Pager [phone] (8am-5pm) 05/18/2015 10:14 AM

## 2015-05-18 NOTE — Progress Notes (Signed)
Pt self extubated Looks ok, no distress Oriented.   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 Will need to watch him for evidence of detox.  Will keep Precedex available Add clonidine  Avoid metoprolol given recent cocaine   Simonne MartinetPeter E Jeffree Cazeau ACNP-BC [facility] Pulmonary/Critical Care Pager # [phone] OR # [phone] if no answer

## 2015-05-18 NOTE — Progress Notes (Signed)
Offsite EEG completed at [facility], results pending. 

## 2015-05-18 NOTE — Progress Notes (Signed)
eLink Physician-Brief Progress Note Patient Name: Leonard CarinaRandy Kopecky DOB: [birth year] MRN: [id number]   Date of Service  05/18/2015  HPI/Events of Note  Bedside nurse reports limited IV access. Has one peripheral IV and is on continuous Precedex infusion.  eICU Interventions  PICC team consult for placement.     Intervention Category Intermediate Interventions: Other:  Lawanda CousinsJennings Edward Trevino 05/18/2015, 6:48 PM

## 2015-05-19 ENCOUNTER — Inpatient Hospital Stay (HOSPITAL_COMMUNITY): Payer: Medicaid Other

## 2015-05-19 LAB — GLUCOSE, CAPILLARY
GLUCOSE-CAPILLARY: 142 mg/dL — AB (ref 65–99)
Glucose-Capillary: 95 mg/dL (ref 65–99)
Glucose-Capillary: 82 mg/dL (ref 65–99)

## 2015-05-19 LAB — COMPREHENSIVE METABOLIC PANEL
ALT: 46 U/L (ref 17–63)
AST: 48 U/L — AB (ref 15–41)
Albumin: 3.1 g/dL — ABNORMAL LOW (ref 3.5–5.0)
Alkaline Phosphatase: 55 U/L (ref 38–126)
Anion gap: 9 (ref 5–15)
CHLORIDE: 109 mmol/L (ref 101–111)
CO2: 21 mmol/L — AB (ref 22–32)
CREATININE: 0.78 mg/dL (ref 0.61–1.24)
Calcium: 8.5 mg/dL — ABNORMAL LOW (ref 8.9–10.3)
GFR calc Af Amer: >60 mL/min (ref >60)
GFR calc non Af Amer: >60 mL/min (ref >60)
Glucose, Bld: 102 mg/dL — ABNORMAL HIGH (ref 65–99)
Potassium: 3.4 mmol/L — ABNORMAL LOW (ref 3.5–5.1)
SODIUM: 139 mmol/L (ref 135–145)
Total Bilirubin: 1.3 mg/dL — ABNORMAL HIGH (ref 0.3–1.2)
Total Protein: 6.2 g/dL — ABNORMAL LOW (ref 6.5–8.1)

## 2015-05-19 LAB — CBC
HCT: 43.2 % (ref 39.0–52.0)
Hemoglobin: 14.7 g/dL (ref 13.0–17.0)
MCH: 30.6 pg (ref 26.0–34.0)
MCHC: 34 g/dL (ref 30.0–36.0)
MCV: 89.8 fL (ref 78.0–100.0)
PLATELETS: 176 10*3/uL (ref 150–400)
RBC: 4.81 MIL/uL (ref 4.22–5.81)
RDW: 13.6 % (ref 11.5–15.5)
WBC: 7.8 10*3/uL (ref 4.0–10.5)

## 2015-05-19 LAB — HEMOGLOBIN A1C
HEMOGLOBIN A1C: 5.4 % (ref 4.8–5.6)
MEAN PLASMA GLUCOSE: 108 mg/dL

## 2015-05-19 LAB — HIV ANTIBODY (ROUTINE TESTING W REFLEX): HIV Screen 4th Generation wRfx: NONREACTIVE

## 2015-05-19 MED ORDER — VANCOMYCIN HCL 10 G IV SOLR
1500.0000 mg | Freq: Two times a day (BID) | INTRAVENOUS | Status: DC
  Administered 2015-05-19: 1500 mg via INTRAVENOUS
  Filled 2015-05-19 (×2): qty 1500

## 2015-05-19 NOTE — Progress Notes (Signed)
Pharmacy Antibiotic Note  Leonard Bray is a 60 y.o. male admitted on 05/17/2015 found unresponsive and intubated for airway protection, suspected aspiration pneumonia. Pharmacy has been consulted for vancomycin 3/23 for GPC clusters in 2/2 blood cultures.  Now growing CNS, repeat blood cultures pending.  ID consulted.  Plan:  Increase vancomycin to 1500mg  IV q12h for improved SCr Check trough at steady state Follow up CNS sensitivities and ID recommendations for treatment  Height: 6' (182.9 cm) Weight: 185 lb 13.6 oz (84.3 kg) IBW/kg (Calculated) : 77.6  Temp (24hrs), Avg:98.2 F (36.8 C), Min:97.5 F (36.4 C), Max:99.1 F (37.3 C)   Recent Labs Lab 05/17/15 1445 05/17/15 1457 05/17/15 1724 05/17/15 2116 05/18/15 0318 05/18/15 1513 05/19/15 0314  WBC 15.8*  --   --  8.9 8.2  --  7.8  CREATININE 1.34*  --   --  0.92 0.97  --  0.78  LATICACIDVEN  --  4.66* 2.3*  --   --  1.1  --     Estimated Creatinine Clearance: 107.8 mL/min (by C-G formula based on Cr of 0.78).    No Known Allergies  Antimicrobials this admission: Unasyn 3/22 >> 3/23 Vancomycin 3/23 >>  Dose adjustments this admission: 3/24: increase vancomycin to 1500mg  IV q12h  Microbiology results: 3/22 BCx: CNS 3/22 MRSA PCR: neg 3/23 HIV Ab: neg 3/23 RPR: neg 3/23 BCx (repeat): ngtd  Thank you for allowing pharmacy to be a part of this patient's care.  Loralee PacasErin Flois [place]ctague, PharmD, BCPS Pager: [phone]  05/19/2015 1:06 PM

## 2015-05-19 NOTE — Progress Notes (Signed)
Coag neg staph growing in blood.  Spoke to ID Recs Stay on vanc for now.  Then once sensitives are complete will need 2 more weeks rx.  No need for TEE per d/w [place].   Simonne MartinetPeter E De Jaworski ACNP-BC [facility] Pulmonary/Critical Care Pager # [phone] OR # [phone] if no answer

## 2015-05-19 NOTE — Progress Notes (Signed)
Nutrition Follow-up  DOCUMENTATION CODES:   Not applicable  INTERVENTION:  -RD continue to monitor -Declined ONS  NUTRITION DIAGNOSIS:   Inadequate oral intake related to other (see comment) (doesn't like food) as evidenced by per patient/family report.  New diagnosis  GOAL:   Patient will meet greater than or equal to 90% of their needs  progressing  MONITOR:   Vent status, Labs, Weight trends, I & O's  REASON FOR ASSESSMENT:   Ventilator    ASSESSMENT:   60 year old male who was found unresponsive today and brought to the emergency department. He was intubated for airway protection. Apparently he asked a friend to bring him because he felt lightheaded. The friend did not stay nor did she provide more history. [place] that he had been found at a gas station. They note a significant criminal history but they have no information about his medical history. History could not be obtained from the patient because he was encephalopathic in the emergency department. After intubation he did become combative and required propofol infusion.  Spoke with [name] at bedside. He endorses good appetite, but that he isn't a huge fan of our food. Denies nausea/vomiting Denies chewing/swallowing issues. His weight is down 5# since admission -> r/t PO intake. Encouraged PO intake of whatever he likes. Declined ONS and Snacks.  He has been transferred to 3rd floor now. Receiving Vanc for Bacteremia awaiting cultures, all other clinical issues have resolved.   Labs and Medications reviewed.   Diet Order:  Diet regular Room service appropriate?: Yes; Fluid consistency:: Thin  Skin:  Reviewed, no issues  Last BM:  PTA  Height:   Ht Readings from Last 1 Encounters:  05/17/15 6' (1.829 m)    Weight:   Wt Readings from Last 1 Encounters:  05/19/15 185 lb 13.6 oz (84.3 kg)    Ideal Body Weight:  80.9 kg  BMI:  Body mass index is 25.2  kg/(m^2).  Estimated Nutritional Needs:   Kcal:  2000-2300  Protein:  85-100 grams  Fluid:  >/= 2L  EDUCATION NEEDS:   No education needs identified at this time  Leonard AnoWilliam M. Shunsuke Granzow, MS, RD LDN After Hours/Weekend Pager [phone]

## 2015-05-19 NOTE — Progress Notes (Signed)
PULMONARY / CRITICAL CARE MEDICINE   Name: Leonard Bray MRN: [id number] DOB: [birth year]    ADMISSION DATE:  05/17/2015 CONSULTATION DATE:  05/17/2015  REFERRING MD:  Dr. Jeraldine Loots, ER physician  CHIEF COMPLAINT:  Acute encephalopathy  HISTORY OF PRESENT ILLNESS:   This is a 60 year old male who was found unresponsive on 3/22 He was intubated for airway protection. [facility] police stated that he had been found at a gas station. They note a significant criminal history but they have no information about his medical history. History could not be obtained from the patient because he was encephalopathic in the emergency department. After intubation he did become combative and required propofol infusion.   SUBJECTIVE:  No distress. Cooperative  VITAL SIGNS: BP 175/82 mmHg  Pulse 51  Temp(Src) 98.6 F (37 C) (Core (Comment))  Resp 17  Ht 6' (1.829 m)  Wt 185 lb 13.6 oz (84.3 kg)  BMI 25.20 kg/m2  SpO2 98%  HEMODYNAMICS:    VENTILATOR SETTINGS: Vent Mode:  [-] PRVC FiO2 (%):  [30 %] 30 % Set Rate:  [23 bmp] 23 bmp Vt Set:  [409 mL] 620 mL PEEP:  [5 cmH20] 5 cmH20 Plateau Pressure:  [23 cmH20] 23 cmH20  INTAKE / OUTPUT: I/O last 3 completed shifts: In: 5160.4 [P.O.:120; I.V.:4390.4; IV Piggyback:650] Out: 3975 [Urine:3725; Emesis/NG output:250]  PHYSICAL EXAMINATION: General:  Sitting up in bed eating breakfast. No distress.  Neuro: awake,Oriented. Cooperative HEENT:  Normocephalic atraumatic, chronic appearing scarring and atrophy of the left eye, right pupil round and reactive to light, sclerae anicteric,  Cardiovascular:  Distant heart sounds, no clear murmur, no JVD Lungs: clear and w/out accessory muscle use  Abdomen:  Bowel sounds positive, nontender nondistended, there are surgical scars from what appears to be an exploratory laparotomy Musculoskeletal:  Normal bulk and tone Skin:  There are old, well-healed jagged scars on his left  arm  LABS:  BMET  Recent Labs Lab 05/17/15 2116 05/18/15 0318 05/19/15 0314  NA 141 141 139  K 4.2 4.0 3.4*  CL 111 113* 109  CO2 20* 20* 21*  BUN 8 6 <5*  CREATININE 0.92 0.97 0.78  GLUCOSE 86 73 102*    Electrolytes  Recent Labs Lab 05/17/15 2116 05/18/15 0318 05/19/15 0314  CALCIUM 8.3* 8.4* 8.5*    CBC  Recent Labs Lab 05/17/15 2116 05/18/15 0318 05/19/15 0314  WBC 8.9 8.2 7.8  HGB 14.1 14.4 14.7  HCT 44.6 43.4 43.2  PLT 188 183 176    Coag's  Recent Labs Lab 05/17/15 1445  INR 1.45    Sepsis Markers  Recent Labs Lab 05/17/15 1457 05/17/15 1724 05/18/15 1513  LATICACIDVEN 4.66* 2.3* 1.1    ABG  Recent Labs Lab 05/17/15 1731  PHART 7.340*  PCO2ART 36.5  PO2ART 346*    Liver Enzymes  Recent Labs Lab 05/17/15 1445 05/19/15 0314  AST 78* 48*  ALT 56 46  ALKPHOS 71 55  BILITOT 0.5 1.3*  ALBUMIN 3.7 3.1*    Cardiac Enzymes No results for input(s): TROPONINI, PROBNP in the last 168 hours.  Glucose  Recent Labs Lab 05/18/15 0820 05/18/15 1157 05/18/15 1726 05/18/15 2041 05/19/15 0019 05/19/15 0352  GLUCAP 79 78 140* 106* 142* 95    Imaging Dg Chest Port 1 View  05/19/2015  CLINICAL DATA:  Respiratory failure. EXAM: PORTABLE CHEST 1 VIEW COMPARISON:  05/17/2015. FINDINGS: Mediastinum hilar structures normal. Heart size stable. Progressive left base subsegmental atelectasis and or mild infiltrate. Mild stable right base  subsegmental atelectasis and/or pleural parenchymal scarring. No significant pleural effusion or pneumothorax. IMPRESSION: Slight interim progression of mild left base subsegmental atelectasis and or infiltrate. Electronically Signed   By: Maisie Fushomas  Register   On: 05/19/2015 07:09     STUDIES:  05/17/2015 CT head: No acute intracranial process  CULTURES: 05/17/2015 urine culture 05/12/2015 respiratory culture 05/17/2015 blood culture GPC clusters (2 bottles)>>> 05/17/2015 respiratory viral  panel 3/23 BC (repeated)>>>  ANTIBIOTICS: 05/17/2015 Unasyn>>>3/23 3/23 vanc>>>  SIGNIFICANT EVENTS: 05/17/2015 admission  LINES/TUBES: 05/17/2015 endotracheal tube   ASSESSMENT / PLAN:    GPC bacteremia: given presentation would question IVDA but he states he has not injected drugs in 4 hears. Has however inhaled heroine recently & his UDS was + for cocaine and marijuana. He does not have any joint prosthesis and has had no recent cuts or wounds. His TTE was negative.  P:   abx changed to vanc-->cont as we await culture date Repeated BCx2 to ensure bacterial clearance  ID consult pending Will likely need TEE depending on culture data (defer to ID)  Acute Encephalopathy (resolved). This was likely d/t sepsis +/- w/d. He was agitated and uncooperative yesterday s/p self extubation. Now appropriate and understands goals of care Plan Cont supportive care   Mildly elevated creatinine, uncertain baseline-->improved w/ IVFs Mild hyperchloremia -->resolved Hypokalemia P:   Encourage PO fluid intake Replace KCL F/u Creatinine    DISCUSSION: This is a 60 year old male who is brought to the emergency department today after feeling lightheaded and then was acutely encephalopathic in the emergency department. The underlying cause is uncertain at this time although highest on d-dx is drug related and sepsis. To date his course has been notable for respiratory failure which is now resolved after the pt self extubated on 3/23. Delirium which was severe & required precedex, this has since resolved and he is off precedex. Heis growing GPC in blood. His initial TTE was negative. Repeat cultures have been sent; awaiting ID in-put. May need TEE. Cleared for transfer to floor. We will ask triad to assume care.    Simonne MartinetPeter E Bobie Caris ACNP-BC [facility] Pul[facility]/Critical Care Pager # [phone] OR # [phone] if no answer  05/19/2015, 8:53 AM

## 2015-05-20 DIAGNOSIS — N179 Acute kidney failure, unspecified: Secondary | ICD-10-CM

## 2015-05-20 DIAGNOSIS — F1910 Other psychoactive substance abuse, uncomplicated: Secondary | ICD-10-CM

## 2015-05-20 MED ORDER — POTASSIUM CHLORIDE CRYS ER 20 MEQ PO TBCR
40.0000 meq | EXTENDED_RELEASE_TABLET | Freq: Once | ORAL | Status: AC
  Administered 2015-05-20: 40 meq via ORAL
  Filled 2015-05-20: qty 2
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
## 2015-05-21 LAB — CULTURE, BLOOD (ROUTINE X 2)

## 2015-05-23 LAB — CULTURE, BLOOD (ROUTINE X 2)
CULTURE: NO GROWTH
CULTURE: NO GROWTH

## 2015-05-24 NOTE — Progress Notes (Addendum)
Progress Note  This patient signed out AGAINST MEDICAL ADVICE on 05/20/15. Please refer to my discharge summary for that date.  I followed up on his final blood culture results while he had been hospitalized. Blood cultures 2 from 05/17/15: Coagulase negative staph which is pansensitive except to oxacillin. Subsequent blood cultures 2 from 05/18/15 are negative.  I discussed with same infectious disease M.D. with whom his case had been discussed by CCM service on 05/19/15 and he advised treating with Bactrim DS one tablet by mouth twice a day 1 month and outpatient follow-up with his PCP. Unfortunately there is no phone number listed in the Epic chart for him. I have consulted case management on the medical floor [facility]( [facility]) to assist me in reaching this patient.  Marcellus ScottHONGALGI,Tien Spooner, MD, FACP, FHM. [facility] Pager [phone]  If 7PM-7AM, please contact night-coverage [email or web address] Password [email or web address] 05/24/2015, 9:07 AM  Addendum   On 3/25,  I attempted to reach patient on cell phone number that was provided by case management without response. I did leave a voicemail message and provided patient with office number to contact me back but haven't heard back from him. I also contacted patient's PCPs office and spoke with Ms. Jerelyn ScottJanet Powell his PCP and updated her on the patient's care and his need to complete antibiotic therapy as above. She was going to have her office staff try to reach patient.  Marcellus ScottHONGALGI,Seab Axel, MD, FACP, FHM. [facility] Pager [phone]  If 7PM-7AM, please contact night-coverage [email or web address] Password [email or web address] 06/02/2015, 6:45 PM

## 2015-06-27 ENCOUNTER — Other Ambulatory Visit: Payer: Self-pay

## 2015-06-27 MED ORDER — OXYCODONE-ACETAMINOPHEN 10-325 MG PO TABS: 1.0000 | ORAL_TABLET | ORAL | Status: DC | PRN

## 2015-11-16 ENCOUNTER — Encounter (HOSPITAL_COMMUNITY): Payer: Self-pay | Admitting: Emergency Medicine

## 2015-11-16 ENCOUNTER — Emergency Department (HOSPITAL_COMMUNITY): Payer: Medicaid Other

## 2015-11-16 ENCOUNTER — Emergency Department (HOSPITAL_COMMUNITY)
Admission: EM | Admit: 2015-11-16 | Discharge: 2015-11-16 | Disposition: A | Discharge status: Home / Self Care | Payer: Medicaid Other | Attending: Emergency Medicine | Admitting: Emergency Medicine

## 2015-11-16 DIAGNOSIS — Y929 Unspecified place or not applicable: Secondary | ICD-10-CM | POA: Insufficient documentation

## 2015-11-16 DIAGNOSIS — Y999 Unspecified external cause status: Secondary | ICD-10-CM | POA: Diagnosis not present

## 2015-11-16 DIAGNOSIS — E876 Hypokalemia: Secondary | ICD-10-CM | POA: Diagnosis not present

## 2015-11-16 DIAGNOSIS — F1910 Other psychoactive substance abuse, uncomplicated: Secondary | ICD-10-CM | POA: Insufficient documentation

## 2015-11-16 DIAGNOSIS — Y939 Activity, unspecified: Secondary | ICD-10-CM | POA: Insufficient documentation

## 2015-11-16 DIAGNOSIS — S80812A Abrasion, left lower leg, initial encounter: Secondary | ICD-10-CM | POA: Insufficient documentation

## 2015-11-16 DIAGNOSIS — T148 Other injury of unspecified body region: Secondary | ICD-10-CM | POA: Diagnosis not present

## 2015-11-16 DIAGNOSIS — R1084 Generalized abdominal pain: Secondary | ICD-10-CM | POA: Insufficient documentation

## 2015-11-16 DIAGNOSIS — S060X0A Concussion without loss of consciousness, initial encounter: Secondary | ICD-10-CM | POA: Insufficient documentation

## 2015-11-16 DIAGNOSIS — Z79899 Other long term (current) drug therapy: Secondary | ICD-10-CM | POA: Diagnosis not present

## 2015-11-16 DIAGNOSIS — S0990XA Unspecified injury of head, initial encounter: Secondary | ICD-10-CM

## 2015-11-16 DIAGNOSIS — F17210 Nicotine dependence, cigarettes, uncomplicated: Secondary | ICD-10-CM | POA: Insufficient documentation

## 2015-11-16 DIAGNOSIS — T07XXXA Unspecified multiple injuries, initial encounter: Secondary | ICD-10-CM

## 2015-11-16 LAB — I-STAT CHEM 8, ED
BUN: 7 mg/dL (ref 6–20)
CALCIUM ION: 1.15 mmol/L (ref 1.15–1.40)
CHLORIDE: 106 mmol/L (ref 101–111)
Creatinine, Ser: 0.9 mg/dL (ref 0.61–1.24)
Glucose, Bld: 91 mg/dL (ref 65–99)
HCT: 50 % (ref 39.0–52.0)
Hemoglobin: 17 g/dL (ref 13.0–17.0)
Potassium: 3.4 mmol/L — ABNORMAL LOW (ref 3.5–5.1)
SODIUM: 139 mmol/L (ref 135–145)
TCO2: 18 mmol/L (ref 0–100)

## 2015-11-16 LAB — CBC
HCT: 47.7 % (ref 39.0–52.0)
HEMOGLOBIN: 16.5 g/dL (ref 13.0–17.0)
MCH: 31.6 pg (ref 26.0–34.0)
MCHC: 34.6 g/dL (ref 30.0–36.0)
MCV: 91.4 fL (ref 78.0–100.0)
PLATELETS: 246 10*3/uL (ref 150–400)
RBC: 5.22 MIL/uL (ref 4.22–5.81)
RDW: 13.8 % (ref 11.5–15.5)
WBC: 8.1 10*3/uL (ref 4.0–10.5)

## 2015-11-16 LAB — COMPREHENSIVE METABOLIC PANEL
ALBUMIN: 3.8 g/dL (ref 3.5–5.0)
ALK PHOS: 91 U/L (ref 38–126)
ALT: 83 U/L — AB (ref 17–63)
ANION GAP: 13 (ref 5–15)
AST: 94 U/L — ABNORMAL HIGH (ref 15–41)
BILIRUBIN TOTAL: 0.9 mg/dL (ref 0.3–1.2)
BUN: 7 mg/dL (ref 6–20)
CALCIUM: 9.7 mg/dL (ref 8.9–10.3)
CO2: 17 mmol/L — ABNORMAL LOW (ref 22–32)
CREATININE: 0.89 mg/dL (ref 0.61–1.24)
Chloride: 107 mmol/L (ref 101–111)
GFR calc Af Amer: >60 mL/min (ref >60)
GFR calc non Af Amer: >60 mL/min (ref >60)
GLUCOSE: 93 mg/dL (ref 65–99)
Potassium: 3.4 mmol/L — ABNORMAL LOW (ref 3.5–5.1)
Sodium: 137 mmol/L (ref 135–145)
TOTAL PROTEIN: 7.2 g/dL (ref 6.5–8.1)

## 2015-11-16 LAB — SAMPLE TO BLOOD BANK

## 2015-11-16 LAB — RAPID URINE DRUG SCREEN, HOSP PERFORMED
Amphetamines: NOT DETECTED
BARBITURATES: NOT DETECTED
BENZODIAZEPINES: NOT DETECTED
COCAINE: POSITIVE — AB
OPIATES: POSITIVE — AB
TETRAHYDROCANNABINOL: NOT DETECTED

## 2015-11-16 LAB — PROTIME-INR
INR: 1.1
Prothrombin Time: 14.3 seconds (ref 11.4–15.2)

## 2015-11-16 LAB — ETHANOL: ALCOHOL ETHYL (B): 41 mg/dL — AB (ref <5)

## 2015-11-16 LAB — URINALYSIS, ROUTINE W REFLEX MICROSCOPIC
BILIRUBIN URINE: NEGATIVE
GLUCOSE, UA: NEGATIVE mg/dL
HGB URINE DIPSTICK: NEGATIVE
KETONES UR: NEGATIVE mg/dL
Leukocytes, UA: NEGATIVE
Nitrite: NEGATIVE
PROTEIN: NEGATIVE mg/dL
Specific Gravity, Urine: 1.023 (ref 1.005–1.030)
pH: 6 (ref 5.0–8.0)

## 2015-11-16 LAB — I-STAT CG4 LACTIC ACID, ED: LACTIC ACID, VENOUS: 4.25 mmol/L — AB (ref 0.5–1.9)

## 2015-11-16 LAB — CBG MONITORING, ED: Glucose-Capillary: 97 mg/dL (ref 65–99)

## 2015-11-16 LAB — CDS SEROLOGY

## 2015-11-16 MED ORDER — TETANUS-DIPHTH-ACELL PERTUSSIS 5-2.5-18.5 LF-MCG/0.5 IM SUSP
0.5000 mL | Freq: Once | INTRAMUSCULAR | Status: AC
  Administered 2015-11-16: 0.5 mL via INTRAMUSCULAR
  Filled 2015-11-16: qty 0.5

## 2015-11-16 MED ORDER — MORPHINE SULFATE (PF) 4 MG/ML IV SOLN
6.0000 mg | Freq: Once | INTRAVENOUS | Status: AC
Start: 2015-11-16 — End: 2015-11-16
  Administered 2015-11-16: 6 mg via INTRAVENOUS
  Filled 2015-11-16: qty 2

## 2015-11-16 MED ORDER — LORAZEPAM 2 MG/ML IJ SOLN
1.0000 mg | Freq: Once | INTRAMUSCULAR | Status: DC
  Filled 2015-11-16: qty 1

## 2015-11-16 MED ORDER — POTASSIUM CHLORIDE 10 MEQ/100ML IV SOLN
10.0000 meq | Freq: Once | INTRAVENOUS | Status: AC
  Administered 2015-11-16: 10 meq via INTRAVENOUS
  Filled 2015-11-16: qty 100

## 2015-11-16 MED ORDER — IOPAMIDOL (ISOVUE-370) INJECTION 76%
INTRAVENOUS | Status: AC
  Administered 2015-11-16: 100 mL
  Filled 2015-11-16: qty 100

## 2015-11-16 MED ORDER — SODIUM CHLORIDE 0.9 % IV SOLN
Freq: Once | INTRAVENOUS | Status: AC
  Administered 2015-11-16: 1000 mL via INTRAVENOUS

## 2015-11-16 MED ORDER — IBUPROFEN 800 MG PO TABS: 800.0000 mg | ORAL_TABLET | Freq: Three times a day (TID) | ORAL | 0 refills | Status: AC

## 2015-11-16 MED ORDER — MORPHINE SULFATE (PF) 4 MG/ML IV SOLN
4.0000 mg | Freq: Once | INTRAVENOUS | Status: AC
  Administered 2015-11-16: 4 mg via INTRAVENOUS
  Filled 2015-11-16: qty 1

## 2015-11-16 MED ORDER — TRAMADOL HCL 50 MG PO TABS: 100.0000 mg | ORAL_TABLET | Freq: Four times a day (QID) | ORAL | 0 refills | Status: AC | PRN

## 2015-11-16 MED ORDER — MORPHINE SULFATE (PF) 4 MG/ML IV SOLN
6.0000 mg | Freq: Once | INTRAVENOUS | Status: AC
  Administered 2015-11-16: 6 mg via INTRAVENOUS
  Filled 2015-11-16: qty 2

## 2015-11-16 MED ORDER — MORPHINE SULFATE (PF) 4 MG/ML IV SOLN
4.0000 mg | Freq: Once | INTRAVENOUS | Status: DC
  Filled 2015-11-16: qty 1

## 2015-11-16 MED ORDER — SODIUM CHLORIDE 0.9 % IV BOLUS (SEPSIS)
1000.0000 mL | Freq: Once | INTRAVENOUS | Status: AC
  Administered 2015-11-16: 1000 mL via INTRAVENOUS

## 2015-11-16 NOTE — ED Provider Notes (Signed)
I have consulted with Dr. Yetta BarreJones of neurosurgery. He has reviewed the CT scans and reports these findings are old. At this time in conjunction with the patient denying cervical pain and not having neurologic deficit the patient will be discharged and we will not pursue MRI. Patient is alert and oriented. He is in no acute distress. I have advised him of his follow-up plan for both a general provider as well as orthopedics. 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 Patient is given tramadol and ibuprofen for pain control.   Arby BarretteMarcy Oakland Fant, MD 11/16/15 2251

## 2015-11-16 NOTE — Discharge Instructions (Signed)
[facility] You may also use the resource guide to find a family doctor for recheck. Return to the emergency department if you have worsening symptoms or concerns. Follow your discharge instructions.
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
## 2015-11-16 NOTE — ED Provider Notes (Signed)
Patient has refused the morphine and Ativan because he unequivocally does not want to go into the MRI. He is distressed and begging not to go into the MRI. At this time options are limited to r/o injury. Will consult neurosurgery for input on CT results combined with patient denying pain in the C-spine and intact neuro exam to try to help determine likelihood of this being acute.   Arby BarretteMarcy Jahki Witham, MD 11/16/15 2157

## 2015-11-16 NOTE — ED Notes (Signed)
Pt refused medication and MRI. Dr. Donnald GarrePfeiffer aware.
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
## 2015-11-16 NOTE — ED Provider Notes (Signed)
Patient care was assumed from Dr. Rennis ChrisJacobowitz with plan for MRI of the cervical spine for anomaly identified on CT. There has been significant difficulty obtaining an MRI. First the patient complained of significant pain with lying down to get his MRI, the patient was given morphine for pain. Subsequently however he then became very agitated and said he had claustrophobia. Patient was again brought back from MRI and he reports that he is very claustrophobic and does not want to get in "the tube". He is denying cervical pain at this time. He is reporting he still has a lot of pain in his hip. Left hip on the CT did not show any fracture. We'll proceed with again treating the patient for both pain and anxiety. I will reassess and determine if with treatment of both anxiey and pain, the patient could be suitable to go back to MRI. Patient is alert and nontoxic. He has no respiratory distress. He has no neurologic deficit. He is moving all 4 extremities without difficulty.   Arby BarretteMarcy Myquan [place]umb[place], MD 11/16/15 2102
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
## 2015-11-16 NOTE — ED Notes (Signed)
Pt made aware of need for urine specimen. Pt would not respond when asked if he could provide urine.
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
## 2016-11-29 ENCOUNTER — Emergency Department (HOSPITAL_COMMUNITY)
Admission: EM | Admit: 2016-11-29 | Discharge: 2016-11-29 | Disposition: A | Discharge status: Home / Self Care | Payer: Medicaid Other | Attending: Emergency Medicine | Admitting: Emergency Medicine

## 2016-11-29 ENCOUNTER — Encounter (HOSPITAL_COMMUNITY): Payer: Self-pay | Admitting: Emergency Medicine

## 2016-11-29 ENCOUNTER — Emergency Department (HOSPITAL_COMMUNITY): Payer: Medicaid Other

## 2016-11-29 DIAGNOSIS — Z79899 Other long term (current) drug therapy: Secondary | ICD-10-CM | POA: Insufficient documentation

## 2016-11-29 DIAGNOSIS — F17210 Nicotine dependence, cigarettes, uncomplicated: Secondary | ICD-10-CM | POA: Insufficient documentation

## 2016-11-29 DIAGNOSIS — R42 Dizziness and giddiness: Secondary | ICD-10-CM | POA: Insufficient documentation

## 2016-11-29 DIAGNOSIS — R55 Syncope and collapse: Secondary | ICD-10-CM | POA: Insufficient documentation

## 2016-11-29 LAB — CBC WITH DIFFERENTIAL/PLATELET
Basophils Absolute: 0.1 10*3/uL (ref 0.0–0.1)
Basophils Relative: 1 %
EOS PCT: 0 %
Eosinophils Absolute: 0 10*3/uL (ref 0.0–0.7)
HEMATOCRIT: 44.6 % (ref 39.0–52.0)
Hemoglobin: 16.1 g/dL (ref 13.0–17.0)
LYMPHS ABS: 2.6 10*3/uL (ref 0.7–4.0)
LYMPHS PCT: 28 %
MCH: 32 pg (ref 26.0–34.0)
MCHC: 36.1 g/dL — ABNORMAL HIGH (ref 30.0–36.0)
MCV: 88.7 fL (ref 78.0–100.0)
MONO ABS: 1 10*3/uL (ref 0.1–1.0)
MONOS PCT: 11 %
NEUTROS ABS: 5.5 10*3/uL (ref 1.7–7.7)
Neutrophils Relative %: 60 %
PLATELETS: 190 10*3/uL (ref 150–400)
RBC: 5.03 MIL/uL (ref 4.22–5.81)
RDW: 13.8 % (ref 11.5–15.5)
WBC: 9.2 10*3/uL (ref 4.0–10.5)

## 2016-11-29 LAB — BASIC METABOLIC PANEL
ANION GAP: 9 (ref 5–15)
BUN: 12 mg/dL (ref 6–20)
CO2: 18 mmol/L — AB (ref 22–32)
Calcium: 8.9 mg/dL (ref 8.9–10.3)
Chloride: 109 mmol/L (ref 101–111)
Creatinine, Ser: 1.04 mg/dL (ref 0.61–1.24)
GFR calc Af Amer: >60 mL/min (ref >60)
GFR calc non Af Amer: >60 mL/min (ref >60)
GLUCOSE: 96 mg/dL (ref 65–99)
POTASSIUM: 4.4 mmol/L (ref 3.5–5.1)
Sodium: 136 mmol/L (ref 135–145)

## 2016-11-29 LAB — I-STAT TROPONIN, ED: Troponin i, poc: 0 ng/mL (ref 0.00–0.08)

## 2016-11-29 NOTE — Discharge Instructions (Signed)
Please avoid taking alcohol with pain medication. Please followup with primary doctor at first availability

## 2016-11-29 NOTE — ED Notes (Signed)
Patient transported to X-ray 

## 2016-11-29 NOTE — ED Notes (Signed)
Pt called out stating he is ready to go, pt advised we are still waiting on labs and the doctor will come speak with him once results are back. Pt verbalized understanding and agreed to wait.

## 2016-11-29 NOTE — ED Notes (Signed)
ED Provider at bedside. 

## 2016-11-29 NOTE — ED Provider Notes (Signed)
[facility]-EMERGENCY DEPT Provider Note   CSN: [id number] Arrival date & time: 11/29/16  1544     History   Chief Complaint Chief Complaint  Patient presents with  . Dizziness    HPI Leonard Bray is a 61 y.o. male.  61 year old male history of chronic back pain and surgery on Elavil who presents with dizziness and presyncopal symptoms.  Patient was recently discharged from incarceration yesterday.  He had been treated while incarcerated with Elavil for chronic back pain. Today he took Elavil and another unknown "pain pill" before drinking two 40 oz of EtOH. Shortly afterwards he felt lightheaded and endorsed presycope. Did not fully pass out. Has resolved sx after receiving IVF by EMS. Denies recent infecitous sx.   The history is provided by the patient. No language interpreter was used.    History reviewed. No pertinent past medical history.  There are no active problems to display for this patient.   History reviewed. No pertinent surgical history.     Home Medications    Prior to Admission medications   Medication Sig Start Date End Date Taking? Authorizing Provider  buprenorphine-naloxone (SUBOXONE) 2-0.5 MG SUBL SL tablet Place 1 tablet under the tongue daily.    [provider]  ibuprofen (ADVIL,MOTRIN) 800 MG tablet Take 1 tablet (800 mg total) by mouth 3 (three) times daily. 11/16/15   Arby Barrette, MD  traMADol (ULTRAM) 50 MG tablet Take 2 tablets (100 mg total) by mouth every 6 (six) hours as needed. 11/16/15   Arby Barrette, MD    Family History No family history on file.  Social History Social History  Substance Use Topics  . Smoking status: Current Every Day Smoker    Packs/day: 2.00    Types: Cigarettes  . Smokeless tobacco: Not on file  . Alcohol use Yes     Comment: 5 40oz beers/day     Allergies   Patient has no known allergies.   Review of Systems Review of Systems  Constitutional: Negative for chills and fever.  HENT:  Negative for ear pain and sore throat.   Eyes: Negative for pain and visual disturbance.  Respiratory: Negative for cough and shortness of breath.   Cardiovascular: Negative for chest pain and palpitations.  Gastrointestinal: Negative for abdominal pain and vomiting.  Genitourinary: Negative for dysuria and hematuria.  Musculoskeletal: Negative for arthralgias and back pain.  Skin: Negative for color change and rash.  Neurological: Positive for dizziness. Negative for seizures and syncope.  All other systems reviewed and are negative.    Physical Exam Updated Vital Signs BP (!) 143/89   Pulse 91   Temp 98.3 F (36.8 C)   Resp 14   SpO2 98%   Physical Exam  Constitutional: He appears well-developed.  HENT:  Head: Normocephalic and atraumatic.  Eyes: Conjunctivae are normal.  Neck: Neck supple.  Cardiovascular: Normal rate and regular rhythm.   No murmur heard. Pulmonary/Chest: Effort normal and breath sounds normal. No respiratory distress.  Abdominal: Soft. There is no tenderness.  Musculoskeletal: He exhibits no edema.  Neurological: He is alert. No cranial nerve deficit. Coordination normal.  5/5 motor strength and intact sensation in all extremities. Intact bilateral finger-to-nose coordination  Skin: Skin is warm and dry.  Nursing note and vitals reviewed.    ED Treatments / Results  Labs (all labs ordered are listed, but only abnormal results are displayed) Labs Reviewed  BASIC METABOLIC PANEL - Abnormal; Notable for the following:       Result  Value   CO2 18 (*)    All other components within normal limits  CBC WITH DIFFERENTIAL/PLATELET - Abnormal; Notable for the following:    MCHC 36.1 (*)    All other components within normal limits  I-STAT TROPONIN, ED    EKG  EKG Interpretation  Date/Time:  Friday November 29 2016 16:34:16 EDT Ventricular Rate:  93 PR Interval:    QRS Duration: 81 QT Interval:  365 QTC Calculation: 454 R Axis:   -19 Text  Interpretation:  Sinus rhythm Borderline left axis deviation Low voltage, precordial leads No significant change since last tracing Confirmed by Melene Plan [phone]) on 11/29/2016 5:15:14 PM       Radiology Dg Chest 2 View  Result Date: 11/29/2016 CLINICAL DATA:  61 year old male with history of dizziness and near syncopal episode. EXAM: CHEST  2 VIEW COMPARISON:  Chest x-ray 11/16/2015. FINDINGS: Lung volumes are normal. No consolidative airspace disease. No pleural effusions. No pneumothorax. No pulmonary nodule or mass noted. Pulmonary vasculature and the cardiomediastinal silhouette are within normal limits. Atherosclerosis in the thoracic aorta. IMPRESSION: 1.  No radiographic evidence of acute cardiopulmonary disease. 2. Aortic atherosclerosis. Electronically Signed   By: Trudie Reed M.D.   On: 11/29/2016 17:07    Procedures Procedures (including critical care time)  Medications Ordered in ED Medications - No data to display   Initial Impression / Assessment and Plan / ED Course  I have reviewed the triage vital signs and the nursing notes.  Pertinent labs & imaging results that were available during my care of the patient were reviewed by me and considered in my medical decision making (see chart for details).     37 yoM h/o chronic back pain and surgery on Elavil who p/w dizziness and presyncope in setting of taking pain medication and EtOH. Just released from jail yesterday. Currently asymptomatic after given IVF by EMS. No focal neuro deficits. Lungs CTAB.  EKG showing sinus rhythm. CBC, BMP, troponin unremarkable.  Suspect dizziness in setting of mixing pain medication and EtOH. Instructed patient to avoid combining these substances. Instructed on PCP f/u for continued evaluation for pain medication RX. He will continued ibuprofen/tylenol for pain. Pt tolerating PO and ambulating well, stable for continued outpatient f/u.  Return precautions provided for worsening  symptoms. Pt will f/u with PCP at first availability. Pt verbalized agreement with plan.  Pt care d/w Dr. Adela Lank  Final Clinical Impressions(s) / ED Diagnoses   Final diagnoses:  Lightheadedness  Dizziness  Near syncope    New Prescriptions Discharge Medication List as of 11/29/2016  5:48 PM       Ansel Ferrall, Homero Fellers, MD 11/30/16 1327    Melene Plan, DO 12/01/16 1453

## 2016-11-29 NOTE — ED Triage Notes (Signed)
Pt was sitting outside all day and began to have dizziness upon sitting in his chair.  Pt reports a near syncopal episode.  Upon EMS arrival pt was diaphoretic, pale and tachypneic.  EMS established an IV and administered IVF.  Pt's HR, BP and RRR improved post fluid.  Upon arrival to ED pt's VS are stable

## 2017-02-19 ENCOUNTER — Emergency Department (HOSPITAL_COMMUNITY)
Admission: EM | Admit: 2017-02-19 | Discharge: 2017-02-20 | Disposition: A | Discharge status: Home / Self Care | Payer: Medicaid Other | Attending: Emergency Medicine | Admitting: Emergency Medicine

## 2017-02-19 ENCOUNTER — Encounter (HOSPITAL_COMMUNITY): Payer: Self-pay

## 2017-02-19 DIAGNOSIS — M545 Low back pain: Secondary | ICD-10-CM | POA: Insufficient documentation

## 2017-02-19 DIAGNOSIS — F17210 Nicotine dependence, cigarettes, uncomplicated: Secondary | ICD-10-CM | POA: Insufficient documentation

## 2017-02-19 DIAGNOSIS — G8929 Other chronic pain: Secondary | ICD-10-CM | POA: Diagnosis not present

## 2017-02-19 HISTORY — DX: Other intervertebral disc displacement, lumbar region: M51.26

## 2017-02-19 NOTE — ED Triage Notes (Signed)
Pt states he has hx of ruptured disc in back and believes he has the same condition today; pt states about an hour ago he started having lower back pain at 10/10; pt has cane on arrival and states he normally does not use a cane but been using it for the last hour; pt state he fell about two hours ago; Pt was inconsistent with hx; states back pain started 5 months ago when asked again-Monique,RN

## 2017-02-20 MED ORDER — KETOROLAC TROMETHAMINE 30 MG/ML IJ SOLN
30.0000 mg | Freq: Once | INTRAMUSCULAR | Status: AC
  Administered 2017-02-20: 30 mg via INTRAMUSCULAR
  Filled 2017-02-20: qty 1

## 2017-02-20 NOTE — ED Notes (Signed)
Dr. Everett Graff. Little ( EDP) explained plan of care to pt.

## 2017-02-20 NOTE — ED Provider Notes (Signed)
[facility] Provider [facility]   CSN: [id number] Arrival date & time: 02/19/17  2009     History   Chief Complaint Chief Complaint  Patient presents with  . Back Pain    HPI Leonard Bray is a 61 y.o. male.  61yo M with past medical history including chronic back pain with previous surgery who presents with back pain.  Patient initially told triage that he had been having severe back pain starting today but later told me that his pain has been going on for 5 or 6 months.  He states he had a fall, initially said 2 hours prior to arrival but later told me the fall was several years ago in jail.  He states he had surgery 6 months ago in [place] for a ruptured disc although there are no records of this.  He has had right foot drop since before the surgery and always walks with a cane.  No fevers or IV drug use, no recent illness.  Pain is constant and severe, worse with movement.  He has been drinking alcohol to try to deal with the pain.   The history is provided by the patient.    Past Medical History:  Diagnosis Date  . RLD (ruptured lumbar disc) 2010    Patient Active Problem List   Diagnosis Date Noted  . Respiratory failure (HCC)   . Lactic acidosis   . Acute encephalopathy 05/17/2015    History reviewed. No pertinent surgical history.     Home Medications    Prior to Admission medications   Medication Sig Start Date End Date Taking? Authorizing Provider  acetaminophen (TYLENOL) 500 MG tablet Take 500-1,000 mg by mouth every 6 (six) hours as needed (FOR BACK PAIN).    Yes [provider]  naproxen sodium (ALEVE) 220 MG tablet Take 220 mg by mouth 3 (three) times daily. FOR BACK PAIN   Yes [provider]    Family History History reviewed. No pertinent family history.  Social History Social History   Tobacco Use  . Smoking status: Current Every Day Smoker  Substance Use Topics  . Alcohol use: Yes    . Drug use: Yes    Types: Marijuana     Allergies   Patient has no known allergies.   Review of Systems Review of Systems All other systems reviewed and are negative except that which was mentioned in HPI   Physical Exam Updated Vital Signs BP 126/83 (BP Location: Left Arm)   Pulse 74   Temp 98.6 F (37 C) (Oral)   Resp 16   Ht 5\' 10"  (1.778 m)   Wt 83.9 kg (185 lb)   SpO2 97%   BMI 26.54 kg/m   Physical Exam  Constitutional: He is oriented to person, place, and time. He appears well-developed and well-nourished. No distress.  HENT:  Head: Normocephalic and atraumatic.  Moist mucous membranes  Eyes: Conjunctivae are normal.  Neck: Neck supple.  Cardiovascular: Normal rate, regular rhythm and normal heart sounds.  No murmur heard. Pulmonary/Chest: Effort normal and breath sounds normal.  Abdominal: Soft. Bowel sounds are normal. He exhibits no distension. There is no tenderness.  Musculoskeletal: He exhibits no edema.  No spinal tenderness  Neurological: He is alert and oriented to person, place, and time. No sensory deficit.  Fluent speech R foot drop (chronic); 5/5 strength b/l leg raise; 1+ DTR R patella, 2+ DTR L patella  Skin: Skin is warm and dry.  Psychiatric: He has a normal mood and affect.  Disheveled appearance  Nursing note and vitals reviewed.    ED Treatments / Results  Labs (all labs ordered are listed, but only abnormal results are displayed) Labs Reviewed - No data to display  EKG  EKG Interpretation None       Radiology No results found.  Procedures Procedures (including critical care time)  Medications Ordered in ED Medications - No data to display   Initial Impression / Assessment and Plan / ED Course  I have reviewed the triage vital signs and the nursing notes.  Pertinent labs & imaging results that were available during my care of the patient were reviewed by me and considered in my medical decision making (see chart  for details).     Chronic back pain, story changed multiple times and he eventually admitted all symptoms chronic. Neuro intact.   Patient demonstrates no lower extremity weakness, saddle anesthesia, bowel or bladder incontinence, or any other neurologic deficits concerning for cauda equina. No fevers or other infectious symptoms to suggest by the patient's back pain is due to an infection. I have reviewed return precautions, including the development of any of these signs or symptoms, and the patient has voiced understanding. I reviewed supportive care instructions, including NSAIDs, early range of motion exercises, and spine f/u given reported hx of surgery and previous steroid injections. Patient voiced understanding and was discharged in satisfactory condition.   Final Clinical Impressions(s) / ED Diagnoses   Final diagnoses:  Chronic low back pain, unspecified back pain laterality, with sciatica presence unspecified    ED Discharge Orders    None       Little, Ambrose Finlandachel Morgan, MD 02/20/17 [phone]

## 2017-04-17 ENCOUNTER — Encounter (HOSPITAL_COMMUNITY): Payer: Self-pay

## 2018-12-12 ENCOUNTER — Emergency Department (HOSPITAL_COMMUNITY)
Admission: EM | Admit: 2018-12-12 | Discharge: 2018-12-13 | Disposition: A | Discharge status: Home / Self Care | Payer: Medicaid Other | Attending: Emergency Medicine | Admitting: Emergency Medicine

## 2018-12-12 ENCOUNTER — Emergency Department (HOSPITAL_COMMUNITY): Payer: Medicaid Other

## 2018-12-12 ENCOUNTER — Other Ambulatory Visit: Payer: Self-pay

## 2018-12-12 ENCOUNTER — Encounter (HOSPITAL_COMMUNITY): Payer: Self-pay

## 2018-12-12 DIAGNOSIS — M5442 Lumbago with sciatica, left side: Secondary | ICD-10-CM | POA: Diagnosis not present

## 2018-12-12 DIAGNOSIS — G8929 Other chronic pain: Secondary | ICD-10-CM | POA: Diagnosis not present

## 2018-12-12 DIAGNOSIS — M21371 Foot drop, right foot: Secondary | ICD-10-CM | POA: Insufficient documentation

## 2018-12-12 DIAGNOSIS — F17210 Nicotine dependence, cigarettes, uncomplicated: Secondary | ICD-10-CM | POA: Insufficient documentation

## 2018-12-12 DIAGNOSIS — M25552 Pain in left hip: Secondary | ICD-10-CM | POA: Diagnosis present

## 2018-12-12 NOTE — ED Triage Notes (Signed)
Pt reports left sided hip pain for a while now, denies injury or fall. Pt unable to bare weight on that leg.

## 2018-12-13 ENCOUNTER — Emergency Department (HOSPITAL_COMMUNITY): Payer: Medicaid Other

## 2018-12-13 MED ORDER — OXYCODONE-ACETAMINOPHEN 5-325 MG PO TABS
1.0000 | ORAL_TABLET | Freq: Once | ORAL | Status: AC
  Administered 2018-12-13: 1 via ORAL
  Filled 2018-12-13: qty 1

## 2018-12-13 MED ORDER — PREDNISONE 10 MG PO TABS: ORAL_TABLET | ORAL | 0 refills | Status: AC

## 2018-12-13 MED ORDER — HYDROCODONE-ACETAMINOPHEN 5-325 MG PO TABS: 1.0000 | ORAL_TABLET | Freq: Four times a day (QID) | ORAL | 0 refills | Status: AC | PRN

## 2018-12-13 NOTE — ED Notes (Signed)
Pt to MRI

## 2018-12-13 NOTE — ED Notes (Signed)
Patient transported to MRI 

## 2018-12-13 NOTE — ED Notes (Signed)
Pt discharged from ED; instructions provided and scripts given; Pt encouraged to return to ED if symptoms worsen and to f/u with PCP; Pt verbalized understanding of all instructions 

## 2018-12-13 NOTE — ED Provider Notes (Signed)
[facility] EMERGENCY DEPARTMENT Provider Note   CSN: [id number] Arrival date & time: 12/12/18  2049     History   Chief Complaint Chief Complaint  Patient presents with   Hip Pain    HPI Leonard Bray is a 63 y.o. male.     Patient with chronic back pain presents for evaluation of worsening symptoms of back pain now radiating to the left lower extremity. He has had pain in the low back for "years" but the pain in the left leg is new in the last 2-3 days. He denies weakness or numbness in the left leg. No urinary or bowel incontinence. He reports weakness in the right leg for the past 6 months causing him to require a cane to help with ambulation. He states this weakness is no worse tonight. No abdominal pain, injury. He has been medicating with alcohol only.  The history is provided by the patient. No language interpreter was used.    Past Medical History:  Diagnosis Date   RLD (ruptured lumbar disc) 2010    Patient Active Problem List   Diagnosis Date Noted   Respiratory failure (HCC)    Lactic acidosis    Acute encephalopathy 05/17/2015    History reviewed. No pertinent surgical history.      Home Medications    Prior to Admission medications   Medication Sig Start Date End Date Taking? Authorizing Provider  acetaminophen (TYLENOL) 500 MG tablet Take 500-1,000 mg by mouth every 6 (six) hours as needed (FOR BACK PAIN).     [provider]  buprenorphine-naloxone (SUBOXONE) 2-0.5 MG SUBL SL tablet Place 1 tablet under the tongue daily.    [provider]  ibuprofen (ADVIL,MOTRIN) 800 MG tablet Take 1 tablet (800 mg total) by mouth 3 (three) times daily. 11/16/15   Arby Barrette, MD  naproxen sodium (ALEVE) 220 MG tablet Take 220 mg by mouth 3 (three) times daily. FOR BACK PAIN    [provider]  traMADol (ULTRAM) 50 MG tablet Take 2 tablets (100 mg total) by mouth every 6 (six) hours as needed. 11/16/15    Arby Barrette, MD    Family History No family history on file.  Social History Social History   Tobacco Use   Smoking status: Current Every Day Smoker    Packs/day: 2.00    Types: Cigarettes  Substance Use Topics   Alcohol use: Yes    Comment: 5 40oz beers/day   Drug use: Yes    Types: IV, Cocaine, Marijuana     Allergies   Patient has no known allergies.   Review of Systems Review of Systems  Respiratory: Negative.  Negative for shortness of breath.   Cardiovascular: Negative.  Negative for chest pain.  Gastrointestinal: Negative.  Negative for abdominal pain, nausea and vomiting.  Genitourinary: Negative for difficulty urinating and enuresis.  Musculoskeletal: Positive for back pain.       See HPI.  Skin: Negative.   Neurological: Positive for weakness (Right LE x 6 months).     Physical Exam Updated Vital Signs BP 134/67    Pulse 83    Temp 98 F (36.7 C) (Oral)    Resp 18    SpO2 100%   Physical Exam Vitals signs and nursing note reviewed.  Constitutional:      Appearance: He is well-developed.  Neck:     Musculoskeletal: Normal range of motion.  Cardiovascular:     Rate and Rhythm: Normal rate.  Pulmonary:  Effort: Pulmonary effort is normal.  Abdominal:     Palpations: Abdomen is soft.     Tenderness: There is no abdominal tenderness.  Musculoskeletal:     Right lower leg: No edema.     Left lower leg: No edema.  Skin:    General: Skin is warm and dry.  Neurological:     Mental Status: He is alert and oriented to person, place, and time.     Sensory: No sensory deficit.     Gait: Gait abnormal.     Deep Tendon Reflexes: Reflexes normal.     Comments: Left LE has full strength and FROM. When ambulatory he is fully weight bearing and has coordinated movement.   Right LE has weakness on dorsiflexion. Gait abnormal with right foot drop.       ED Treatments / Results  Labs (all labs ordered are listed, but only abnormal results are  displayed) Labs Reviewed - No data to display  EKG None  Radiology No results found.  Procedures Procedures (including critical care time)  Medications Ordered in ED Medications  oxyCODONE-acetaminophen (PERCOCET/ROXICET) 5-325 MG per tablet 1 tablet (has no administration in time range)     Initial Impression / Assessment and Plan / ED Course  I have reviewed the triage vital signs and the nursing notes.  Pertinent labs & imaging results that were available during my care of the patient were reviewed by me and considered in my medical decision making (see chart for details).        Patient to ED with chronic back pain with new pain in left lower extremity. Walks with a cane secondary to right leg weakness. No loss of urinary or bowel incontinence.   Exam concerning regarding right LE weakness or dorsiflexion - foot drop. Patient confirms that the right weakness has been present for 6 months and is no worse tonight. He reports increased pain in back and new left leg pain prompting ED visit. MRI obtained for further evaluation.   MRI shows no acute abnormality. There is disc bulge and spinal stenosis at L2-3 thru L4-5. There is also evidence of foraminal stenosis bilaterally.   Patient updated on results and importance of follow up with either neurosurgery or orthopedics for further management in the outpatient setting. Return precautions of increased or changed right sided weakness, loss of bladder/bowel control, new weakness on the left were discussed. Pain control provided. Discussed alcohol use while taking these medications was not advised and needs to be avoided. Prednisone taper dose provided.   Final Clinical Impressions(s) / ED Diagnoses   Final diagnoses:  None   1. Low back pain, chronic 2. Right foot drop, subacute 3. Lumbar radiculopathy  ED Discharge Orders    None       Charlann Lange, PA-C 12/13/18 0551    Merrily Pew, MD 12/13/18 [phone]

## 2018-12-13 NOTE — ED Notes (Signed)
Called MRI, pt is second in que for scan.

## 2018-12-13 NOTE — Discharge Instructions (Addendum)
It is important that you follow up with neurosurgery or orthopedics for further evaluation and management of your right leg weakness and worsening left sided pain. Return to the emergency department with any worsening symptoms or new concerns. Take medications as prescribed.

## 2019-12-27 DEATH — deceased

## 2020-05-07 IMAGING — MR MR LUMBAR SPINE W/O CM
4 of 5 series · 25 of 48 positions shown · non-contrast
Comparison: None.

CLINICAL DATA: Initial evaluation for rapidly progressive back
pain.

EXAM:
MRI LUMBAR SPINE WITHOUT CONTRAST
TECHNIQUE: Multiplanar, multisequence MR imaging of the lumbar spine was
performed. No intravenous contrast was administered.

[Series 5: T2 · sagittal · 4.0mm · 0.73mm/px · 6 of 17 slices shown (1 of 2)]
[im 1/17]
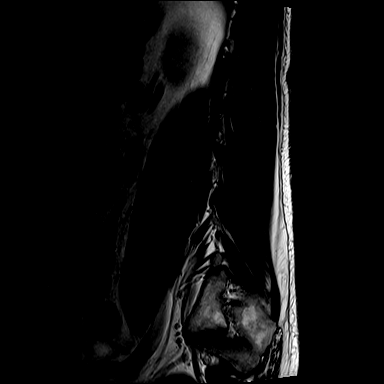
[im 4/17]
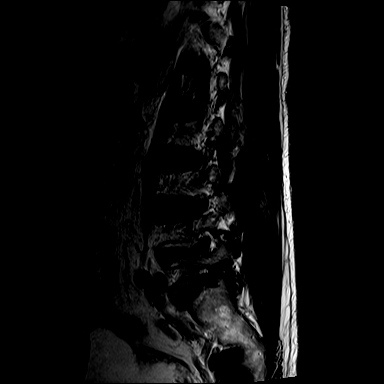
[im 7/17]
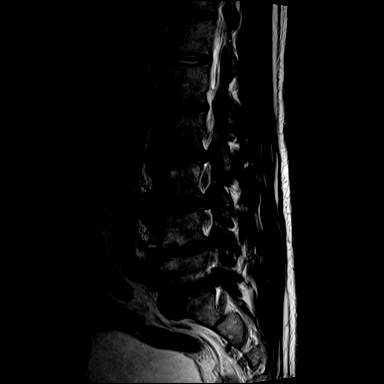
[im 10/17]
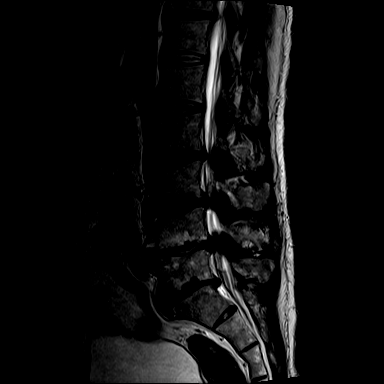
[im 13/17]
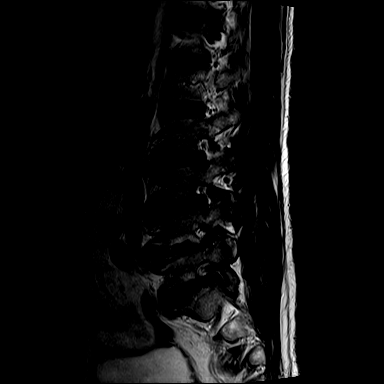
[im 17/17]
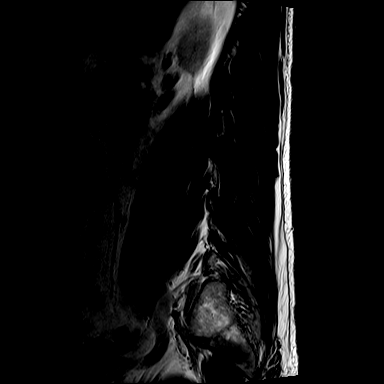

[Series 7: T1 · sagittal · 4.0mm · 0.88mm/px · 7 of 17 slices shown (1 of 2)]
[im 1/17]
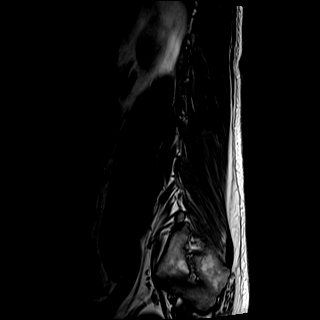
[im 3/17]
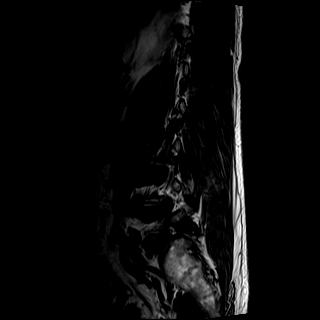
[im 6/17]
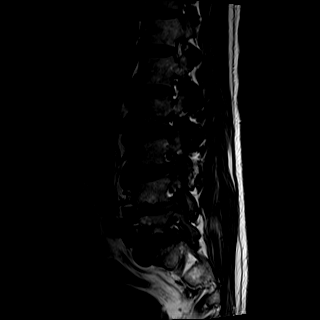
[im 9/17]
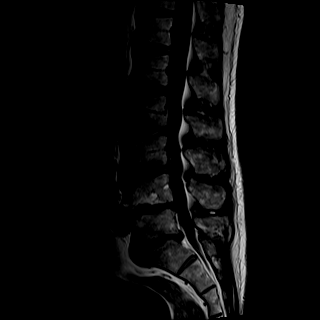
[im 11/17]
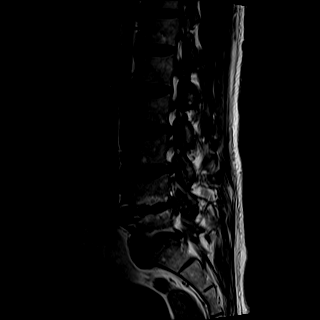
[im 14/17]
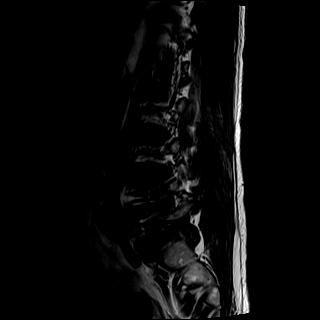
[im 17/17]
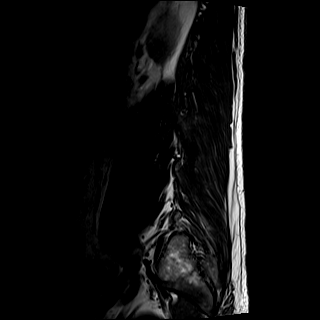

[Series 8: T2 · axial · 4.0mm · 0.57mm/px · z∈[-113,+100]mm · 8 of 36 slices shown (2 of 2)]
[im 1/36]
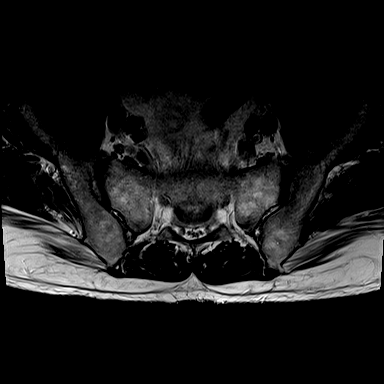
[im 6/36]
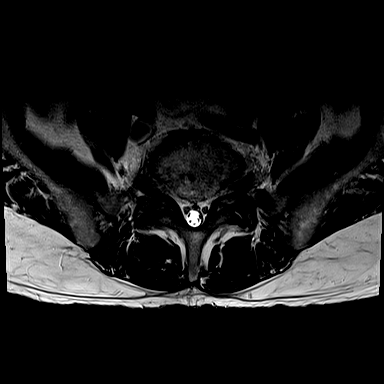
[im 11/36]
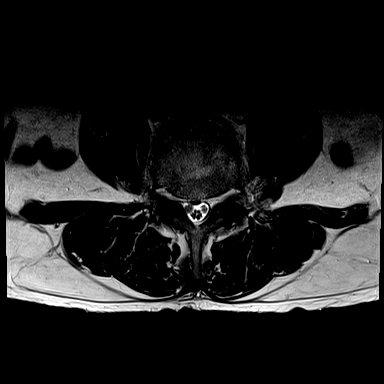
[im 17/36]
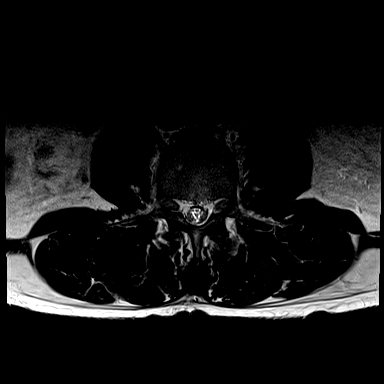
[im 19/36]
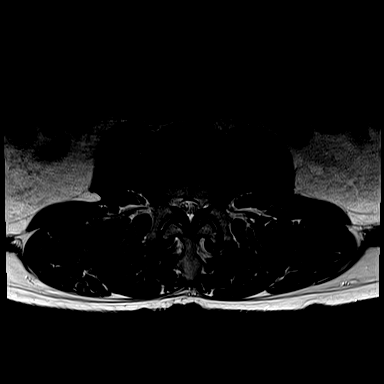
[im 25/36]
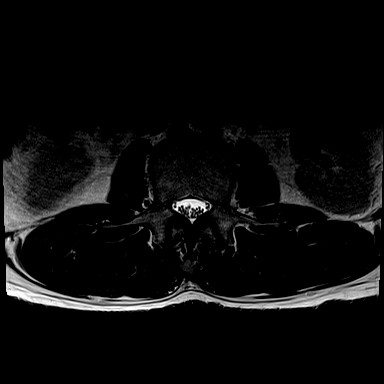
[im 30/36]
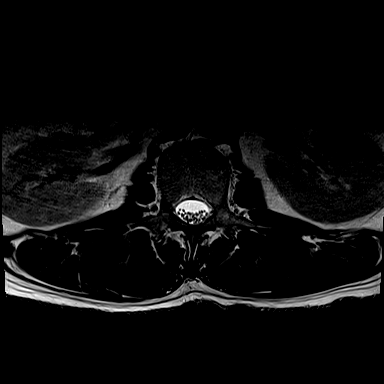
[im 36/36]
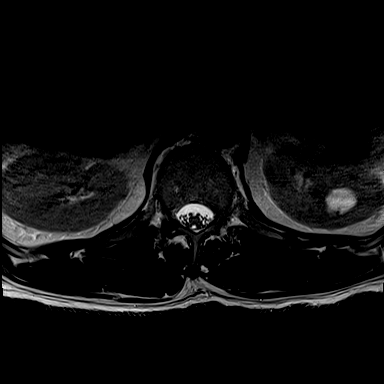

[Series 9: T1 · axial · 4.0mm · 0.34mm/px · z∈[-113,+70]mm · 4 of 36 slices shown (2 of 2)]
[im 1/36]
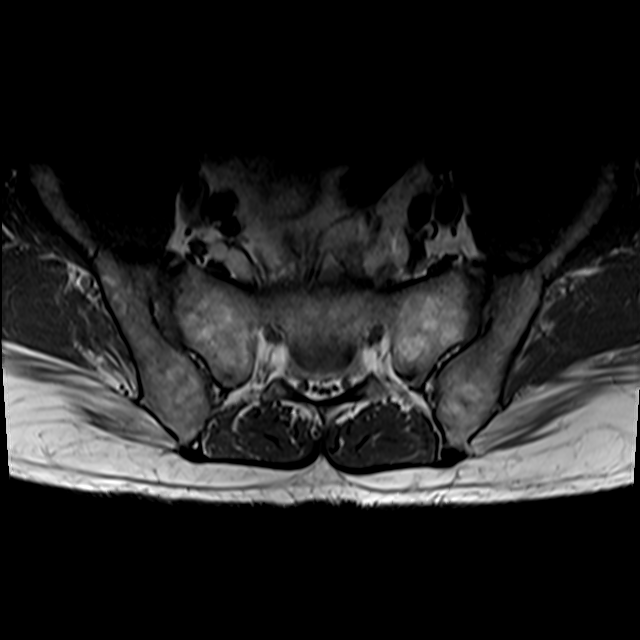
[im 6/36]
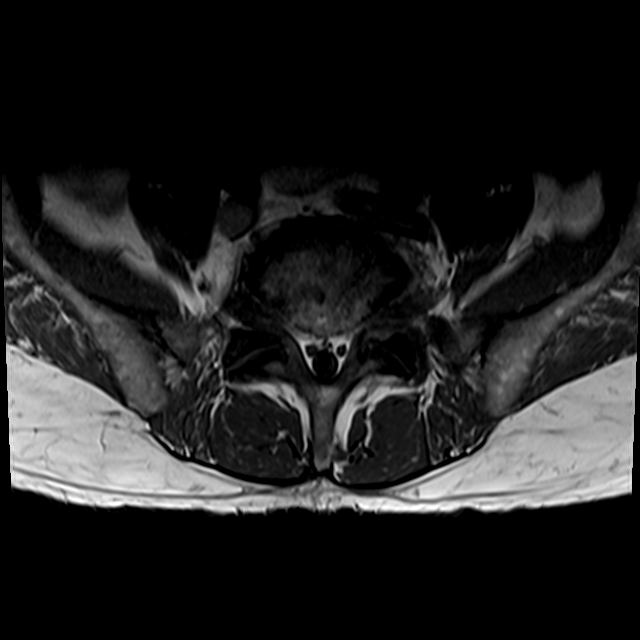
[im 19/36]
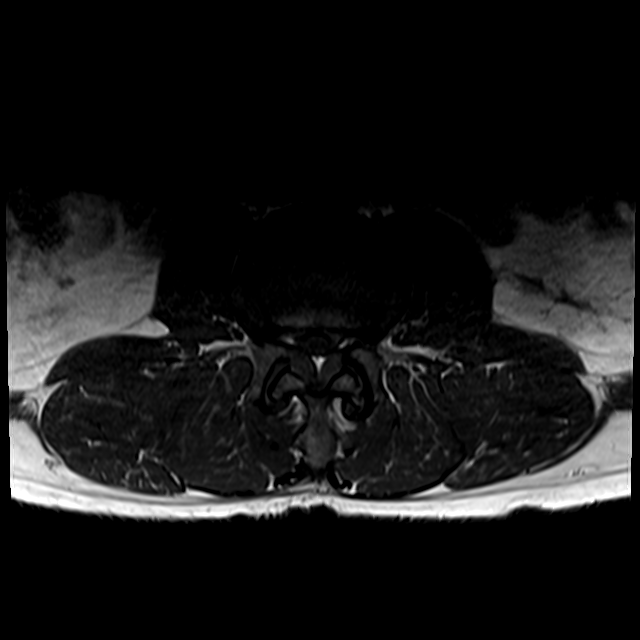
[im 30/36]
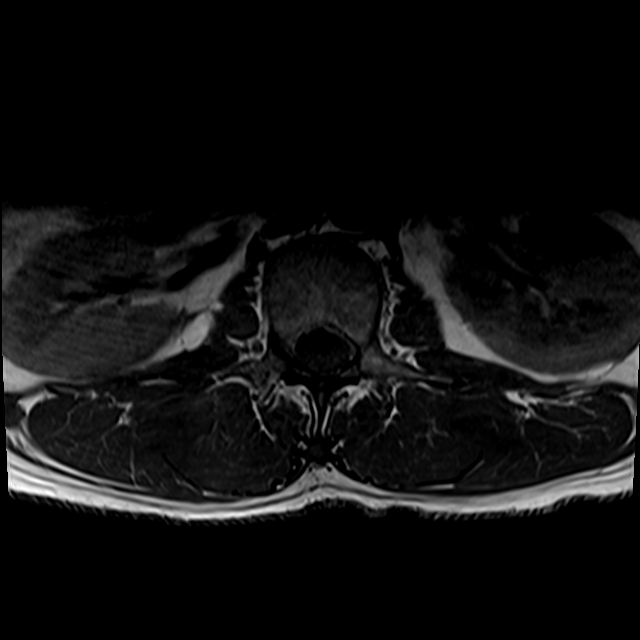

[25 of 48 positions shown; findings below may reference images not displayed]

FINDINGS: Segmentation: Standard. Lowest well-formed disc labeled the L5-S1
level.

Alignment: Mild straightening of the normal lumbar lordosis. No
listhesis or subluxation.

Vertebrae: Vertebral body heights maintained without evidence for
acute or chronic fracture. Bone marrow signal intensity mildly
heterogeneous but within normal limits. Few scattered benign
hemangiomata noted. No worrisome osseous lesions. Prominent reactive
endplate changes noted about the L4-5 and L5-S1 interspaces. No
other abnormal marrow edema.

Conus medullaris and cauda equina: Conus extends to the L1 level.
Conus and cauda equina appear normal.

Paraspinal and other soft tissues: Paraspinous soft tissues
demonstrate no acute finding. T2 hyperintense simple left renal cyst
partially visualized. Moderate distension of the partially
visualized urinary bladder noted. Visualized visceral structures
otherwise unremarkable.

Disc levels:

Diffuse congenital shortening of the pedicles noted.

T11-12: Seen only on sagittal projection. Mild diffuse disc bulge
with posterior element hypertrophy. No significant stenosis.

T12-L1: Negative interspace.  Mild facet hypertrophy.  No stenosis.

L1-2: Normal interspace. Mild facet hypertrophy. No significant
canal or foraminal stenosis.

L2-3: Diffuse disc bulge with disc desiccation and intervertebral
disc space narrowing. Mild facet and ligament flavum hypertrophy.
Changes superimposed on short pedicles result in moderate to severe
spinal stenosis. Mild to moderate bilateral foraminal narrowing.

L3-4: Diffuse disc bulge with disc desiccation and intervertebral
disc space narrowing. Superimposed small central disc protrusion
mildly indents the ventral thecal sac. Moderate facet and ligament
flavum hypertrophy. Changes superimposed on short pedicles resultant
severe spinal stenosis. Moderate bilateral L3 foraminal narrowing.

L4-5: Chronic intervertebral disc space narrowing with diffuse disc
bulge and disc desiccation. Reactive endplate osteophytic spurring.
Moderate facet and ligament flavum hypertrophy. Changes superimposed
on short pedicles resultant severe spinal stenosis. Moderate
bilateral L4 foraminal narrowing, right slightly worse than left.

L5-S1: Chronic intervertebral disc space narrowing with diffuse disc
bulge and disc desiccation. Reactive endplate changes with marginal
endplate osteophytic spurring. Moderate facet and ligament flavum
hypertrophy. Central canal remains patent. Moderate to advanced
bilateral L5 foraminal stenosis.
IMPRESSION: 1. No acute abnormality within the lumbar spine.
2. Acquired on congenital moderate to severe diffuse spinal stenosis
at L2-3 through L4-5 as above, most pronounced at L3-4 and L4-5.
3. Multifactorial degenerative changes with resultant moderate
bilateral L3 and L4 foraminal stenosis, with moderate to severe
bilateral L5 foraminal narrowing.
